# Patient Record
Sex: Female | Born: 1975 | Race: White | Hispanic: No | Marital: Married | Smoking: Never smoker
Health system: Southern US, Community
[De-identification: ages and names within clinical notes are randomized; demographics above are authoritative.]

## PROBLEM LIST (undated history)

## (undated) DIAGNOSIS — T7840XA Allergy, unspecified, initial encounter: Secondary | ICD-10-CM

## (undated) DIAGNOSIS — N200 Calculus of kidney: Secondary | ICD-10-CM

## (undated) DIAGNOSIS — R87619 Unspecified abnormal cytological findings in specimens from cervix uteri: Secondary | ICD-10-CM

## (undated) DIAGNOSIS — IMO0002 Reserved for concepts with insufficient information to code with codable children: Secondary | ICD-10-CM

## (undated) DIAGNOSIS — I341 Nonrheumatic mitral (valve) prolapse: Secondary | ICD-10-CM

## (undated) DIAGNOSIS — F909 Attention-deficit hyperactivity disorder, unspecified type: Secondary | ICD-10-CM

## (undated) DIAGNOSIS — F419 Anxiety disorder, unspecified: Secondary | ICD-10-CM

## (undated) HISTORY — DX: Nonrheumatic mitral (valve) prolapse: I34.1

## (undated) HISTORY — DX: Unspecified abnormal cytological findings in specimens from cervix uteri: R87.619

## (undated) HISTORY — DX: Reserved for concepts with insufficient information to code with codable children: IMO0002

## (undated) HISTORY — DX: Allergy, unspecified, initial encounter: T78.40XA

## (undated) HISTORY — PX: LAPAROSCOPY: SHX197

## (undated) HISTORY — PX: KNEE SURGERY: SHX244

## (undated) HISTORY — DX: Calculus of kidney: N20.0

## (undated) HISTORY — DX: Anxiety disorder, unspecified: F41.9

## (undated) HISTORY — DX: Attention-deficit hyperactivity disorder, unspecified type: F90.9

## (undated) HISTORY — PX: COLPOSCOPY: SHX161

---

## 2003-12-27 ENCOUNTER — Ambulatory Visit: Payer: Self-pay

## 2004-03-10 ENCOUNTER — Ambulatory Visit (HOSPITAL_COMMUNITY): Admission: RE | Admit: 2004-03-10 | Discharge: 2004-03-10 | Payer: Self-pay | Admitting: Gynecology

## 2005-01-30 ENCOUNTER — Ambulatory Visit (HOSPITAL_COMMUNITY): Admission: RE | Admit: 2005-01-30 | Discharge: 2005-01-30 | Payer: Self-pay | Admitting: Gynecology

## 2005-05-01 ENCOUNTER — Ambulatory Visit: Payer: Self-pay | Admitting: Family Medicine

## 2005-05-04 ENCOUNTER — Ambulatory Visit: Payer: Self-pay | Admitting: Family Medicine

## 2005-05-07 ENCOUNTER — Ambulatory Visit: Payer: Self-pay | Admitting: Family Medicine

## 2005-05-14 ENCOUNTER — Ambulatory Visit: Payer: Self-pay | Admitting: Gynecology

## 2005-05-17 ENCOUNTER — Ambulatory Visit: Payer: Self-pay | Admitting: Obstetrics & Gynecology

## 2005-05-31 ENCOUNTER — Ambulatory Visit: Payer: Self-pay | Admitting: Obstetrics & Gynecology

## 2005-06-12 ENCOUNTER — Ambulatory Visit: Payer: Self-pay | Admitting: Family Medicine

## 2005-06-19 ENCOUNTER — Ambulatory Visit: Payer: Self-pay | Admitting: Family Medicine

## 2005-06-22 ENCOUNTER — Ambulatory Visit: Payer: Self-pay | Admitting: *Deleted

## 2005-06-22 ENCOUNTER — Inpatient Hospital Stay (HOSPITAL_COMMUNITY): Admission: AD | Admit: 2005-06-22 | Discharge: 2005-06-24 | Payer: Self-pay | Admitting: Obstetrics & Gynecology

## 2005-06-25 ENCOUNTER — Encounter: Admission: RE | Admit: 2005-06-25 | Discharge: 2005-07-24 | Payer: Self-pay | Admitting: Gynecology

## 2005-07-25 ENCOUNTER — Encounter: Admission: RE | Admit: 2005-07-25 | Discharge: 2005-08-24 | Payer: Self-pay | Admitting: Gynecology

## 2005-08-25 ENCOUNTER — Encounter: Admission: RE | Admit: 2005-08-25 | Discharge: 2005-09-24 | Payer: Self-pay | Admitting: Gynecology

## 2005-09-13 ENCOUNTER — Observation Stay: Payer: Self-pay | Admitting: Surgery

## 2005-09-25 ENCOUNTER — Encounter: Admission: RE | Admit: 2005-09-25 | Discharge: 2005-10-24 | Payer: Self-pay | Admitting: Gynecology

## 2005-10-25 ENCOUNTER — Encounter: Admission: RE | Admit: 2005-10-25 | Discharge: 2005-11-24 | Payer: Self-pay | Admitting: Gynecology

## 2005-11-13 ENCOUNTER — Ambulatory Visit: Payer: Self-pay | Admitting: Obstetrics and Gynecology

## 2005-11-13 ENCOUNTER — Encounter (INDEPENDENT_AMBULATORY_CARE_PROVIDER_SITE_OTHER): Payer: Self-pay | Admitting: Specialist

## 2005-11-13 ENCOUNTER — Ambulatory Visit: Payer: Self-pay | Admitting: Family Medicine

## 2005-11-16 ENCOUNTER — Ambulatory Visit: Payer: Self-pay | Admitting: Family Medicine

## 2005-11-25 ENCOUNTER — Encounter: Admission: RE | Admit: 2005-11-25 | Discharge: 2005-12-24 | Payer: Self-pay | Admitting: Gynecology

## 2005-12-25 ENCOUNTER — Encounter: Admission: RE | Admit: 2005-12-25 | Discharge: 2006-01-24 | Payer: Self-pay | Admitting: Gynecology

## 2006-01-10 ENCOUNTER — Ambulatory Visit: Payer: Self-pay | Admitting: Obstetrics & Gynecology

## 2006-01-25 ENCOUNTER — Encounter: Admission: RE | Admit: 2006-01-25 | Discharge: 2006-02-24 | Payer: Self-pay | Admitting: Gynecology

## 2006-02-25 ENCOUNTER — Encounter: Admission: RE | Admit: 2006-02-25 | Discharge: 2006-03-25 | Payer: Self-pay | Admitting: Gynecology

## 2006-03-26 ENCOUNTER — Encounter: Admission: RE | Admit: 2006-03-26 | Discharge: 2006-04-25 | Payer: Self-pay | Admitting: Gynecology

## 2006-04-26 ENCOUNTER — Encounter: Admission: RE | Admit: 2006-04-26 | Discharge: 2006-05-25 | Payer: Self-pay | Admitting: Gynecology

## 2006-05-26 ENCOUNTER — Encounter: Admission: RE | Admit: 2006-05-26 | Discharge: 2006-06-25 | Payer: Self-pay | Admitting: Gynecology

## 2006-06-26 ENCOUNTER — Encounter: Admission: RE | Admit: 2006-06-26 | Discharge: 2006-07-25 | Payer: Self-pay | Admitting: Gynecology

## 2006-07-26 ENCOUNTER — Encounter: Admission: RE | Admit: 2006-07-26 | Discharge: 2006-08-25 | Payer: Self-pay | Admitting: Gynecology

## 2006-08-26 ENCOUNTER — Encounter: Admission: RE | Admit: 2006-08-26 | Discharge: 2006-09-25 | Payer: Self-pay | Admitting: Gynecology

## 2006-09-26 ENCOUNTER — Encounter: Admission: RE | Admit: 2006-09-26 | Discharge: 2006-10-25 | Payer: Self-pay | Admitting: Gynecology

## 2006-10-26 ENCOUNTER — Encounter: Admission: RE | Admit: 2006-10-26 | Discharge: 2006-11-25 | Payer: Self-pay | Admitting: Gynecology

## 2006-11-26 ENCOUNTER — Encounter: Admission: RE | Admit: 2006-11-26 | Discharge: 2006-12-25 | Payer: Self-pay | Admitting: Gynecology

## 2006-12-26 ENCOUNTER — Encounter: Admission: RE | Admit: 2006-12-26 | Discharge: 2007-01-25 | Payer: Self-pay | Admitting: Gynecology

## 2007-01-26 ENCOUNTER — Encounter: Admission: RE | Admit: 2007-01-26 | Discharge: 2007-02-25 | Payer: Self-pay | Admitting: Gynecology

## 2007-02-26 ENCOUNTER — Encounter: Admission: RE | Admit: 2007-02-26 | Discharge: 2007-03-28 | Payer: Self-pay | Admitting: Gynecology

## 2008-04-19 ENCOUNTER — Encounter: Payer: Self-pay | Admitting: Family Medicine

## 2008-04-19 ENCOUNTER — Ambulatory Visit: Payer: Self-pay | Admitting: Family Medicine

## 2008-04-19 LAB — CONVERTED CEMR LAB
HCT: 39 % (ref 36.0–46.0)
Hemoglobin: 13.1 g/dL (ref 12.0–15.0)
MCHC: 33.6 g/dL (ref 30.0–36.0)
MCV: 90.3 fL (ref 78.0–100.0)
Platelets: 257 10*3/uL (ref 150–400)
RDW: 12.1 % (ref 11.5–15.5)
TSH: 1.636 microintl units/mL (ref 0.350–4.500)
WBC: 7.6 10*3/uL (ref 4.0–10.5)

## 2008-10-20 ENCOUNTER — Ambulatory Visit: Payer: Self-pay | Admitting: Psychiatry

## 2008-10-28 ENCOUNTER — Ambulatory Visit: Payer: Self-pay | Admitting: Psychiatry

## 2008-11-04 ENCOUNTER — Ambulatory Visit: Payer: Self-pay | Admitting: Psychiatry

## 2008-11-18 ENCOUNTER — Ambulatory Visit: Payer: Self-pay | Admitting: Psychiatry

## 2008-11-25 ENCOUNTER — Ambulatory Visit: Payer: Self-pay | Admitting: Psychiatry

## 2008-12-16 ENCOUNTER — Ambulatory Visit: Payer: Self-pay | Admitting: Psychiatry

## 2009-01-12 ENCOUNTER — Ambulatory Visit: Payer: Self-pay | Admitting: Psychiatry

## 2009-01-26 ENCOUNTER — Ambulatory Visit: Payer: Self-pay | Admitting: Psychiatry

## 2009-02-22 ENCOUNTER — Ambulatory Visit: Payer: Self-pay | Admitting: Family Medicine

## 2009-05-04 ENCOUNTER — Ambulatory Visit: Payer: Self-pay | Admitting: Family Medicine

## 2009-07-28 ENCOUNTER — Ambulatory Visit: Payer: Self-pay | Admitting: Family Medicine

## 2009-09-07 ENCOUNTER — Ambulatory Visit: Payer: Self-pay | Admitting: Family Medicine

## 2009-09-09 ENCOUNTER — Ambulatory Visit: Payer: Self-pay | Admitting: Family Medicine

## 2010-01-28 ENCOUNTER — Encounter: Payer: Self-pay | Admitting: Allergy and Immunology

## 2010-05-23 NOTE — Assessment & Plan Note (Signed)
Kristen Wu, Kristen Wu NO.:  [id number]   MEDICAL RECORD NO.:  [id number]          PATIENT TYPE:  POB   LOCATION:  [facility] at [facility]         FACILITY:  [facility]   PHYSICIAN:  Tinnie Gens, MD        DATE OF BIRTH:  1975/09/02   DATE OF SERVICE:  04/19/2008                                  CLINIC NOTE   HISTORY OF PRESENT ILLNESS:  The patient is a 35 year old gravida 3,  para 3, who comes today for yearly exam.  She has last been seen here in  2008, when she was seen for occult blood with low grade SIL.  She was  laid off and without insurance, so she did not follow up on any of her  Paps.  She has recently started back at [facility] for the last 5 months, but  she continues to have difficulty with concentration, being cranky,  having crying spells, poor sleeping, awakening every 2-3 hours, early  a.m. awakening, her weight is stable.  Her appetite has been up and  down.  She feels fatigued all the time.  She does report her kids are  well.  She has had some marital strife as well and issues with her  friends.  She is unwilling to go on medication at this time, but is  worried about her mood.   PAST MEDICAL HISTORY:  Significant for mitral valve prolapse.   PAST SURGICAL HISTORY:  Laparoscopy in 2001 for questionable ectopic,  right knee scope.   MEDICATIONS:  She is on Mirena IUD and B12.   ALLERGIES:  SULFA drugs.   FAMILY HISTORY:  Negative.   SOCIAL HISTORY:  No tobacco, alcohol, or drug use.   OBSTETRICAL HISTORY:  G3, P3, and 3 vaginal deliveries.   GYNECOLOGICAL HISTORY:  The patient has some amenorrhea secondary to IUD  which has been in place for the past 3 years.   REVIEW OF SYSTEMS:  A 14-point review of systems is reviewed.  Please  see HPI.  She denies chest pain, shortness of breath, headache, vision  changes, loss of hearing, nausea, vomiting, diarrhea, constipation,  fevers, chills, easy bruising, vaginal discharge, vaginal odor, or pain  with  intercourse.   PHYSICAL EXAMINATION:  VITAL SIGNS:  On exam today, her vitals are as in  the chart.  Her weight is stable 136.  GENERAL:  She is a well-developed, well-nourished female in no acute  distress.  HEENT:  Normocephalic and atraumatic.  Sclerae anicteric.  NECK:  Supple.  Normal thyroid.  LUNGS:  Clear bilaterally.  CV:  Regular rate and rhythm.  No rubs, gallops, or murmurs.  ABDOMEN:  Soft, nontender, and nondistended.  EXTREMITIES:  There is no cyanosis, clubbing, or edema.  BREASTS:  Symmetric with everted nipples.  No masses.  No  supraclavicular or axillary adenopathy.  GU:  Normal external female genitalia.  BUS normal.  Vagina is pink and  rugated.  Cervix is parous without lesion.  Uterus is small, anteverted.  No adnexal mass or tenderness.   IMPRESSION:  1. Gynecological exam.  2. Situational depression and anxiety.   PLAN:  1. We will refer her to Dr.  Dirk Dress.  2. Check TSH and CBC.  3. Pap smear today.  4. Follow up for medication if needed for depression.           ______________________________  Tinnie Gens, MD     TP/MEDQ  D:  04/19/2008  T:  04/20/2008  Job:  [id number]

## 2010-05-23 NOTE — Group Therapy Note (Signed)
NAMEMENNA, ABELN NO.:  [id number]   MEDICAL RECORD NO.:  [id number]          PATIENT TYPE:  POB   LOCATION:  [facility]                   FACILITY:  [facility]   PHYSICIAN:  Caren Griffins, CNM       DATE OF BIRTH:  05-Apr-1975   DATE OF SERVICE:                                    CLINIC NOTE   REASON FOR VISIT:  Desires IUD insertion, 4 months' postpartum.   HISTORY:  This is a 35 year old G3, P3-0-0-3, who has had a spontaneous  vaginal delivery 4-1/2 months ago that was uncomplicated.  She breastfed for  2 months then changed to bottle.  Pecola Leisure is thriving, in fact, 75th and 90th  percentile for growth.  She denies having had problems with abnormal  bleeding, with postpartum depression, stress or sleep.  She is counseled on  IUD including risks and has signed a consent.   Her UPT today is negative.  She has a single sexual partner and has no  history of STDs.  Denies any vaginal discharge or itching.  She does  frequent Kegel exercises and denies SUI.   She has no allergies and is on no regular medications.   PAST MEDICAL HISTORY:  Significant for having mitral valve prolapse which  was diagnosed several years ago during an evaluation for chest pain, which  has not recurred.   SURGERIES:  1. Right knee in 1992.  2. Laparoscopic for suspected ectopic in 2001 with her second pregnancy.   SOCIAL HISTORY:  She is a nonsmoker.   PHYSICAL EXAMINATION:  VITAL SIGNS:  Blood pressure 112/71, pulse 75, weight  135.  NECK:  Supple with no thyroid enlargement.  HEART:  Regular rate and rhythm.  No murmur or click is appreciated.  LUNGS:  Clear to auscultation bilateral.  ABDOMEN:  Soft, flat, nontender.  PELVIC:  NEFG.  Good tone.  Fair support.  Vagina rugated, pink.  Cervix is  mid to anterior.  Bimanual--uterus is retroverted.  The tenaculum is first  applied at 10 and 2 with traction and some difficultly inserting the sound.  Tenaculum was reapplied at 4 and 8 and  sounded to 5.5-6 and Jearld Adjutant was  placed with minimal difficulty.  She has some excessive bleeding from the  site of the tenaculum.  Speculum was reinserted, a clot removed.  There is  no active bleeding.  It is not necessary to do anything to stop bleeding.   The patient tolerated the procedure well.   ASSESSMENT:  1. Multiparous normal postpartum.  2. IUD contraception.   PLAN:  She will do string checks and monitor whether the IUD is in place  after menses.  Of note, the string was cut at approximately 2-2.5  centimeters length and she will return in 3 months for a string check.           ______________________________  Caren Griffins, CNM     DP/MEDQ  D:  11/13/2005  T:  11/14/2005  Job:  [id number]

## 2011-05-10 ENCOUNTER — Ambulatory Visit: Payer: Self-pay | Admitting: Family Medicine

## 2011-05-14 ENCOUNTER — Ambulatory Visit: Payer: Self-pay | Admitting: Family Medicine

## 2011-05-22 ENCOUNTER — Ambulatory Visit (INDEPENDENT_AMBULATORY_CARE_PROVIDER_SITE_OTHER): Payer: Self-pay | Admitting: Family Medicine

## 2011-05-22 ENCOUNTER — Encounter: Payer: Self-pay | Admitting: Family Medicine

## 2011-05-22 VITALS — BP 126/82 | HR 61 | Ht 66.0 in | Wt 130.0 lb

## 2011-05-22 DIAGNOSIS — R102 Pelvic and perineal pain: Secondary | ICD-10-CM

## 2011-05-22 DIAGNOSIS — Z124 Encounter for screening for malignant neoplasm of cervix: Secondary | ICD-10-CM

## 2011-05-22 DIAGNOSIS — I059 Rheumatic mitral valve disease, unspecified: Secondary | ICD-10-CM

## 2011-05-22 DIAGNOSIS — F411 Generalized anxiety disorder: Secondary | ICD-10-CM

## 2011-05-22 DIAGNOSIS — J45909 Unspecified asthma, uncomplicated: Secondary | ICD-10-CM | POA: Insufficient documentation

## 2011-05-22 DIAGNOSIS — I341 Nonrheumatic mitral (valve) prolapse: Secondary | ICD-10-CM

## 2011-05-22 DIAGNOSIS — N200 Calculus of kidney: Secondary | ICD-10-CM | POA: Insufficient documentation

## 2011-05-22 DIAGNOSIS — J309 Allergic rhinitis, unspecified: Secondary | ICD-10-CM

## 2011-05-22 DIAGNOSIS — F419 Anxiety disorder, unspecified: Secondary | ICD-10-CM

## 2011-05-22 DIAGNOSIS — F909 Attention-deficit hyperactivity disorder, unspecified type: Secondary | ICD-10-CM

## 2011-05-22 DIAGNOSIS — N949 Unspecified condition associated with female genital organs and menstrual cycle: Secondary | ICD-10-CM

## 2011-05-22 DIAGNOSIS — Z1151 Encounter for screening for human papillomavirus (HPV): Secondary | ICD-10-CM

## 2011-05-22 DIAGNOSIS — G44209 Tension-type headache, unspecified, not intractable: Secondary | ICD-10-CM

## 2011-05-22 DIAGNOSIS — Z01419 Encounter for gynecological examination (general) (routine) without abnormal findings: Secondary | ICD-10-CM

## 2011-05-22 NOTE — Progress Notes (Signed)
  Subjective:     Kristen Wu is a 36 y.o. female and is here for a comprehensive physical exam. The patient reports problems - reports 1 month h/o low back pain on the right.  Saw PCP who thought it might be kidney stones.  Passed one this am.  Had CT which showed multiple stones.  She also had u/s which was suggestive of pelvic congestion syndrome.  Reports several day h/o pain in RLQ also.  She is also due for IUD change which expired 11/2010, no cycles have returned as yet. Marland Kitchen  History   Social History  . Marital Status: Married    Spouse Name: N/A    Number of Children: N/A  . Years of Education: N/A   Occupational History  . Not on file.   Social History Main Topics  . Smoking status: Never Smoker   . Smokeless tobacco: Not on file  . Alcohol Use: No     occassionally  . Drug Use: No  . Sexually Active: Yes -- Female partner(s)    Birth Control/ Protection: IUD   Other Topics Concern  . Not on file   Social History Narrative  . No narrative on file   Health Maintenance  Topic Date Due  . Pap Smear  01/13/1992  . Tetanus/tdap  01/12/1993  . Influenza Vaccine  10/09/2011    The following portions of the patient's history were reviewed and updated as appropriate: allergies, current medications, past family history, past medical history, past social history, past surgical history and problem list.  Review of Systems A comprehensive review of systems was negative.   Objective:    BP 126/82  Pulse 61  Ht 5\' 6"  (1.676 m)  Wt 130 lb (58.968 kg)  BMI 20.98 kg/m2 General appearance: alert, cooperative and appears stated age Head: Normocephalic, without obvious abnormality, atraumatic Neck: no adenopathy, supple, symmetrical, trachea midline and thyroid not enlarged, symmetric, no tenderness/mass/nodules Lungs: clear to auscultation bilaterally Breasts: normal appearance, no masses or tenderness Heart: regular rate and rhythm, S1, S2 normal, no murmur, click, rub or  gallop Abdomen: soft, non-tender; bowel sounds normal; no masses,  no organomegaly Pelvic: cervix normal in appearance, external genitalia normal, no adnexal masses or tenderness, no cervical motion tenderness, uterus normal size, shape, and consistency and vagina normal without discharge Extremities: extremities normal, atraumatic, no cyanosis or edema Pulses: 2+ and symmetric Skin: Skin color, texture, turgor normal. No rashes or lesions Lymph nodes: Cervical, supraclavicular, and axillary nodes normal. Neurologic: Grossly normal    Assessment:    Healthy female exam. RLQ pain of unclear etiology.  Passed kidney stone without relief.  Interested in pursuing w/u/rx for pelvic congestion with interventional radiology. Discussed possible dx scope for pain, other potential causes and reasons for stones.       Plan:  Pap smear Blood work per PCP Referral to interventional radiology for w/u of PCS. Return for IUD replacement.   See After Visit Summary for Counseling Recommendations

## 2011-05-22 NOTE — Patient Instructions (Addendum)
Levonorgestrel intrauterine device (IUD) What is this medicine? LEVONORGESTREL IUD (LEE voe nor jes trel) is a contraceptive (birth control) device. It is used to prevent pregnancy and to treat heavy bleeding that occurs during your period. It can be used for up to 5 years. This medicine may be used for other purposes; ask your health care provider or pharmacist if you have questions. What should I tell my health care provider before I take this medicine? They need to know if you have any of these conditions: -abnormal Pap smear -cancer of the breast, uterus, or cervix -diabetes -endometritis -genital or pelvic infection now or in the past -have more than one sexual partner or your partner has more than one partner -heart disease -history of an ectopic or tubal pregnancy -immune system problems -IUD in place -liver disease or tumor -problems with blood clots or take blood-thinners -use intravenous drugs -uterus of unusual shape -vaginal bleeding that has not been explained -an unusual or allergic reaction to levonorgestrel, other hormones, silicone, or polyethylene, medicines, foods, dyes, or preservatives -pregnant or trying to get pregnant -breast-feeding How should I use this medicine? This device is placed inside the uterus by a health care professional. Talk to your pediatrician regarding the use of this medicine in children. Special care may be needed. Overdosage: If you think you have taken too much of this medicine contact a poison control center or emergency room at once. NOTE: This medicine is only for you. Do not share this medicine with others. What if I miss a dose? This does not apply. What may interact with this medicine? Do not take this medicine with any of the following medications: -amprenavir -bosentan -fosamprenavir This medicine may also interact with the following medications: -aprepitant -barbiturate medicines for inducing sleep or treating  seizures -bexarotene -griseofulvin -medicines to treat seizures like carbamazepine, ethotoin, felbamate, oxcarbazepine, phenytoin, topiramate -modafinil -pioglitazone -rifabutin -rifampin -rifapentine -some medicines to treat HIV infection like atazanavir, indinavir, lopinavir, nelfinavir, tipranavir, ritonavir -St. John's wort -warfarin This list may not describe all possible interactions. Give your health care provider a list of all the medicines, herbs, non-prescription drugs, or dietary supplements you use. Also tell them if you smoke, drink alcohol, or use illegal drugs. Some items may interact with your medicine. What should I watch for while using this medicine? Visit your doctor or health care professional for regular check ups. See your doctor if you or your partner has sexual contact with others, becomes HIV positive, or gets a sexual transmitted disease. This product does not protect you against HIV infection (AIDS) or other sexually transmitted diseases. You can check the placement of the IUD yourself by reaching up to the top of your vagina with clean fingers to feel the threads. Do not pull on the threads. It is a good habit to check placement after each menstrual period. Call your doctor right away if you feel more of the IUD than just the threads or if you cannot feel the threads at all. The IUD may come out by itself. You may become pregnant if the device comes out. If you notice that the IUD has come out use a backup birth control method like condoms and call your health care provider. Using tampons will not change the position of the IUD and are okay to use during your period. What side effects may I notice from receiving this medicine? Side effects that you should report to your doctor or health care professional as soon as possible: -allergic reactions   like skin rash, itching or hives, swelling of the face, lips, or tongue -fever, flu-like symptoms -genital sores -high  blood pressure -no menstrual period for 6 weeks during use -pain, swelling, warmth in the leg -pelvic pain or tenderness -severe or sudden headache -signs of pregnancy -stomach cramping -sudden shortness of breath -trouble with balance, talking, or walking -unusual vaginal bleeding, discharge -yellowing of the eyes or skin Side effects that usually do not require medical attention (report to your doctor or health care professional if they continue or are bothersome): -acne -breast pain -change in sex drive or performance -changes in weight -cramping, dizziness, or faintness while the device is being inserted -headache -irregular menstrual bleeding within first 3 to 6 months of use -nausea This list may not describe all possible side effects. Call your doctor for medical advice about side effects. You may report side effects to FDA at 1-800-FDA-1088. Where should I keep my medicine? This does not apply. NOTE: This sheet is a summary. It may not cover all possible information. If you have questions about this medicine, talk to your doctor, pharmacist, or health care provider.  2012, [employer]. (01/16/2008 6:39:08 PM)Preventive Care for Adults, Female A healthy lifestyle and preventive care can promote health and wellness. Preventive health guidelines for women include the following key practices.  A routine yearly physical is a good way to check with your caregiver about your health and preventive screening. It is a chance to share any concerns and updates on your health, and to receive a thorough exam.   Visit your dentist for a routine exam and preventive care every 6 months. Brush your teeth twice a day and floss once a day. Good oral hygiene prevents tooth decay and gum disease.   The frequency of eye exams is based on your age, health, family medical history, use of contact lenses, and other factors. Follow your caregiver's recommendations for frequency of eye exams.    Eat a healthy diet. Foods like vegetables, fruits, whole grains, low-fat dairy products, and lean protein foods contain the nutrients you need without too many calories. Decrease your intake of foods high in solid fats, added sugars, and salt. Eat the right amount of calories for you.Get information about a proper diet from your caregiver, if necessary.   Regular physical exercise is one of the most important things you can do for your health. Most adults should get at least 150 minutes of moderate-intensity exercise (any activity that increases your heart rate and causes you to sweat) each week. In addition, most adults need muscle-strengthening exercises on 2 or more days a week.   Maintain a healthy weight. The body mass index (BMI) is a screening tool to identify possible weight problems. It provides an estimate of body fat based on height and weight. Your caregiver can help determine your BMI, and can help you achieve or maintain a healthy weight.For adults 20 years and older:   A BMI below 18.5 is considered underweight.   A BMI of 18.5 to 24.9 is normal.   A BMI of 25 to 29.9 is considered overweight.   A BMI of 30 and above is considered obese.   Maintain normal blood lipids and cholesterol levels by exercising and minimizing your intake of saturated fat. Eat a balanced diet with plenty of fruit and vegetables. Blood tests for lipids and cholesterol should begin at age 104 and be repeated every 5 years. If your lipid or cholesterol levels are high, you are over 50,  or you are at high risk for heart disease, you may need your cholesterol levels checked more frequently.Ongoing high lipid and cholesterol levels should be treated with medicines if diet and exercise are not effective.   If you smoke, find out from your caregiver how to quit. If you do not use tobacco, do not start.   If you are pregnant, do not drink alcohol. If you are breastfeeding, be very cautious about drinking  alcohol. If you are not pregnant and choose to drink alcohol, do not exceed 1 drink per day. One drink is considered to be 12 ounces (355 mL) of beer, 5 ounces (148 mL) of wine, or 1.5 ounces (44 mL) of liquor.   Avoid use of street drugs. Do not share needles with anyone. Ask for help if you need support or instructions about stopping the use of drugs.   High blood pressure causes heart disease and increases the risk of stroke. Your blood pressure should be checked at least every 1 to 2 years. Ongoing high blood pressure should be treated with medicines if weight loss and exercise are not effective.   If you are 31 to 37 years old, ask your caregiver if you should take aspirin to prevent strokes.   Diabetes screening involves taking a blood sample to check your fasting blood sugar level. This should be done once every 3 years, after age 39, if you are within normal weight and without risk factors for diabetes. Testing should be considered at a younger age or be carried out more frequently if you are overweight and have at least 1 risk factor for diabetes.   Breast cancer screening is essential preventive care for women. You should practice "breast self-awareness." This means understanding the normal appearance and feel of your breasts and may include breast self-examination. Any changes detected, no matter how small, should be reported to a caregiver. Women in their 80s and 30s should have a clinical breast exam (CBE) by a caregiver as part of a regular health exam every 1 to 3 years. After age 25, women should have a CBE every year. Starting at age 25, women should consider having a mammography (breast X-ray test) every year. Women who have a family history of breast cancer should talk to their caregiver about genetic screening. Women at a high risk of breast cancer should talk to their caregivers about having magnetic resonance imaging (MRI) and a mammography every year.   The Pap test is a screening  test for cervical cancer. A Pap test can show cell changes on the cervix that might become cervical cancer if left untreated. A Pap test is a procedure in which cells are obtained and examined from the lower end of the uterus (cervix).   Women should have a Pap test starting at age 10.   Between ages 87 and 42, Pap tests should be repeated every 2 years.   Beginning at age 35, you should have a Pap test every 3 years as long as the past 3 Pap tests have been normal.   Some women have medical problems that increase the chance of getting cervical cancer. Talk to your caregiver about these problems. It is especially important to talk to your caregiver if a new problem develops soon after your last Pap test. In these cases, your caregiver may recommend more frequent screening and Pap tests.   The above recommendations are the same for women who have or have not gotten the vaccine for human papillomavirus (HPV).  If you had a hysterectomy for a problem that was not cancer or a condition that could lead to cancer, then you no longer need Pap tests. Even if you no longer need a Pap test, a regular exam is a good idea to make sure no other problems are starting.   If you are between ages 16 and 6, and you have had normal Pap tests going back 10 years, you no longer need Pap tests. Even if you no longer need a Pap test, a regular exam is a good idea to make sure no other problems are starting.   If you have had past treatment for cervical cancer or a condition that could lead to cancer, you need Pap tests and screening for cancer for at least 20 years after your treatment.   If Pap tests have been discontinued, risk factors (such as a new sexual partner) need to be reassessed to determine if screening should be resumed.   The HPV test is an additional test that may be used for cervical cancer screening. The HPV test looks for the virus that can cause the cell changes on the cervix. The cells collected  during the Pap test can be tested for HPV. The HPV test could be used to screen women aged 57 years and older, and should be used in women of any age who have unclear Pap test results. After the age of 60, women should have HPV testing at the same frequency as a Pap test.   Colorectal cancer can be detected and often prevented. Most routine colorectal cancer screening begins at the age of 41 and continues through age 38. However, your caregiver may recommend screening at an earlier age if you have risk factors for colon cancer. On a yearly basis, your caregiver may provide home test kits to check for hidden blood in the stool. Use of a small camera at the end of a tube, to directly examine the colon (sigmoidoscopy or colonoscopy), can detect the earliest forms of colorectal cancer. Talk to your caregiver about this at age 74, when routine screening begins. Direct examination of the colon should be repeated every 5 to 10 years through age 79, unless early forms of pre-cancerous polyps or small growths are found.   Hepatitis C blood testing is recommended for all people born from [birth year] through 1965 and any individual with known risks for hepatitis C.   Practice safe sex. Use condoms and avoid high-risk sexual practices to reduce the spread of sexually transmitted infections (STIs). STIs include gonorrhea, chlamydia, syphilis, trichomonas, herpes, HPV, and human immunodeficiency virus (HIV). Herpes, HIV, and HPV are viral illnesses that have no cure. They can result in disability, cancer, and death. Sexually active women aged 23 and younger should be checked for chlamydia. Older women with new or multiple partners should also be tested for chlamydia. Testing for other STIs is recommended if you are sexually active and at increased risk.   Osteoporosis is a disease in which the bones lose minerals and strength with aging. This can result in serious bone fractures. The risk of osteoporosis can be identified using  a bone density scan. Women ages 42 and over and women at risk for fractures or osteoporosis should discuss screening with their caregivers. Ask your caregiver whether you should take a calcium supplement or vitamin D to reduce the rate of osteoporosis.   Menopause can be associated with physical symptoms and risks. Hormone replacement therapy is available to decrease symptoms and risks.  You should talk to your caregiver about whether hormone replacement therapy is right for you.   Use sunscreen with sun protection factor (SPF) of 30 or more. Apply sunscreen liberally and repeatedly throughout the day. You should seek shade when your shadow is shorter than you. Protect yourself by wearing long sleeves, pants, a wide-brimmed hat, and sunglasses year round, whenever you are outdoors.   Once a month, do a whole body skin exam, using a mirror to look at the skin on your back. Notify your caregiver of new moles, moles that have irregular borders, moles that are larger than a pencil eraser, or moles that have changed in shape or color.   Stay current with required immunizations.   Influenza. You need a dose every fall (or winter). The composition of the flu vaccine changes each year, so being vaccinated once is not enough.   Pneumococcal polysaccharide. You need 1 to 2 doses if you smoke cigarettes or if you have certain chronic medical conditions. You need 1 dose at age 59 (or older) if you have never been vaccinated.   Tetanus, diphtheria, pertussis (Tdap, Td). Get 1 dose of Tdap vaccine if you are younger than age 1, are over 62 and have contact with an infant, are a Research scientist (physical sciences), are pregnant, or simply want to be protected from whooping cough. After that, you need a Td booster dose every 10 years. Consult your caregiver if you have not had at least 3 tetanus and diphtheria-containing shots sometime in your life or have a deep or dirty wound.   HPV. You need this vaccine if you are a woman age 56  or younger. The vaccine is given in 3 doses over 6 months.   Measles, mumps, rubella (MMR). You need at least 1 dose of MMR if you were born in 1957 or later. You may also need a second dose.   Meningococcal. If you are age 53 to 50 and a first-year college student living in a residence hall, or have one of several medical conditions, you need to get vaccinated against meningococcal disease. You may also need additional booster doses.   Zoster (shingles). If you are age 66 or older, you should get this vaccine.   Varicella (chickenpox). If you have never had chickenpox or you were vaccinated but received only 1 dose, talk to your caregiver to find out if you need this vaccine.   Hepatitis A. You need this vaccine if you have a specific risk factor for hepatitis A virus infection or you simply wish to be protected from this disease. The vaccine is usually given as 2 doses, 6 to 18 months apart.   Hepatitis B. You need this vaccine if you have a specific risk factor for hepatitis B virus infection or you simply wish to be protected from this disease. The vaccine is given in 3 doses, usually over 6 months.  Preventive Services / Frequency Ages 42 to 21  Blood pressure check.** / Every 1 to 2 years.   Lipid and cholesterol check.** / Every 5 years beginning at age 29.   Clinical breast exam.** / Every 3 years for women in their 37s and 30s.   Pap test.** / Every 2 years from ages 80 through 45. Every 3 years starting at age 6 through age 50 or 55 with a history of 3 consecutive normal Pap tests.   HPV screening.** / Every 3 years from ages 40 through ages 49 to 48 with a history of 3  consecutive normal Pap tests.   Hepatitis C blood test.** / For any individual with known risks for hepatitis C.   Skin self-exam. / Monthly.   Influenza immunization.** / Every year.   Pneumococcal polysaccharide immunization.** / 1 to 2 doses if you smoke cigarettes or if you have certain chronic medical  conditions.   Tetanus, diphtheria, pertussis (Tdap, Td) immunization. / A one-time dose of Tdap vaccine. After that, you need a Td booster dose every 10 years.   HPV immunization. / 3 doses over 6 months, if you are 80 and younger.   Measles, mumps, rubella (MMR) immunization. / You need at least 1 dose of MMR if you were born in 1957 or later. You may also need a second dose.   Meningococcal immunization. / 1 dose if you are age 83 to 98 and a first-year college student living in a residence hall, or have one of several medical conditions, you need to get vaccinated against meningococcal disease. You may also need additional booster doses.   Varicella immunization.** / Consult your caregiver.   Hepatitis A immunization.** / Consult your caregiver. 2 doses, 6 to 18 months apart.   Hepatitis B immunization.** / Consult your caregiver. 3 doses usually over 6 months.  Ages 52 to 30  Blood pressure check.** / Every 1 to 2 years.   Lipid and cholesterol check.** / Every 5 years beginning at age 57.   Clinical breast exam.** / Every year after age 54.   Mammogram.** / Every year beginning at age 72 and continuing for as long as you are in good health. Consult with your caregiver.   Pap test.** / Every 3 years starting at age 70 through age 7 or 74 with a history of 3 consecutive normal Pap tests.   HPV screening.** / Every 3 years from ages 25 through ages 76 to 13 with a history of 3 consecutive normal Pap tests.   Fecal occult blood test (FOBT) of stool. / Every year beginning at age 9 and continuing until age 69. You may not need to do this test if you get a colonoscopy every 10 years.   Flexible sigmoidoscopy or colonoscopy.** / Every 5 years for a flexible sigmoidoscopy or every 10 years for a colonoscopy beginning at age 52 and continuing until age 31.   Hepatitis C blood test.** / For all people born from [birth year] through 1966 and any individual with known risks for hepatitis C.    Skin self-exam. / Monthly.   Influenza immunization.** / Every year.   Pneumococcal polysaccharide immunization.** / 1 to 2 doses if you smoke cigarettes or if you have certain chronic medical conditions.   Tetanus, diphtheria, pertussis (Tdap, Td) immunization.** / A one-time dose of Tdap vaccine. After that, you need a Td booster dose every 10 years.   Measles, mumps, rubella (MMR) immunization. / You need at least 1 dose of MMR if you were born in 1957 or later. You may also need a second dose.   Varicella immunization.** / Consult your caregiver.   Meningococcal immunization.** / Consult your caregiver.   Hepatitis A immunization.** / Consult your caregiver. 2 doses, 6 to 18 months apart.   Hepatitis B immunization.** / Consult your caregiver. 3 doses, usually over 6 months.  Ages 60 and over  Blood pressure check.** / Every 1 to 2 years.   Lipid and cholesterol check.** / Every 5 years beginning at age 42.   Clinical breast exam.** / Every year after age  40.   Mammogram.** / Every year beginning at age 65 and continuing for as long as you are in good health. Consult with your caregiver.   Pap test.** / Every 3 years starting at age 57 through age 70 or 48 with a 3 consecutive normal Pap tests. Testing can be stopped between 65 and 70 with 3 consecutive normal Pap tests and no abnormal Pap or HPV tests in the past 10 years.   HPV screening.** / Every 3 years from ages 59 through ages 93 or 53 with a history of 3 consecutive normal Pap tests. Testing can be stopped between 65 and 70 with 3 consecutive normal Pap tests and no abnormal Pap or HPV tests in the past 10 years.   Fecal occult blood test (FOBT) of stool. / Every year beginning at age 35 and continuing until age 52. You may not need to do this test if you get a colonoscopy every 10 years.   Flexible sigmoidoscopy or colonoscopy.** / Every 5 years for a flexible sigmoidoscopy or every 10 years for a colonoscopy  beginning at age 69 and continuing until age 27.   Hepatitis C blood test.** / For all people born from [birth year] through 1966 and any individual with known risks for hepatitis C.   Osteoporosis screening.** / A one-time screening for women ages 48 and over and women at risk for fractures or osteoporosis.   Skin self-exam. / Monthly.   Influenza immunization.** / Every year.   Pneumococcal polysaccharide immunization.** / 1 dose at age 59 (or older) if you have never been vaccinated.   Tetanus, diphtheria, pertussis (Tdap, Td) immunization. / A one-time dose of Tdap vaccine if you are over 65 and have contact with an infant, are a Research scientist (physical sciences), or simply want to be protected from whooping cough. After that, you need a Td booster dose every 10 years.   Varicella immunization.** / Consult your caregiver.   Meningococcal immunization.** / Consult your caregiver.   Hepatitis A immunization.** / Consult your caregiver. 2 doses, 6 to 18 months apart.   Hepatitis B immunization.** / Check with your caregiver. 3 doses, usually over 6 months.  ** Family history and personal history of risk and conditions may change your caregiver's recommendations. Document Released: 02/20/2001 Document Revised: 12/14/2010 Document Reviewed: 05/22/2010 [facility] Patient Information 2012 [place], [place]. Kidney Stones Kidney stones (ureteral lithiasis) are deposits that form inside your kidneys. The intense pain is caused by the stone moving through the urinary tract. When the stone moves, the ureter goes into spasm around the stone. The stone is usually passed in the urine.  CAUSES   A disorder that makes certain neck glands produce too much parathyroid hormone (primary hyperparathyroidism).   A buildup of uric acid crystals.   Narrowing (stricture) of the ureter.   A kidney obstruction present at birth (congenital obstruction).   Previous surgery on the kidney or ureters.   Numerous kidney infections.   SYMPTOMS   Feeling sick to your stomach (nauseous).   Throwing up (vomiting).   Blood in the urine (hematuria).   Pain that usually spreads (radiates) to the groin.   Frequency or urgency of urination.  DIAGNOSIS   Taking a history and physical exam.   Blood or urine tests.   Computerized X-ray scan (CT scan).   Occasionally, an examination of the inside of the urinary bladder (cystoscopy) is performed.  TREATMENT   Observation.   Increasing your fluid intake.   Surgery may be needed  if you have severe pain or persistent obstruction.  The size, location, and chemical composition are all important variables that will determine the proper choice of action for you. Talk to your caregiver to better understand your situation so that you will minimize the risk of injury to yourself and your kidney.  HOME CARE INSTRUCTIONS   Drink enough water and fluids to keep your urine clear or pale yellow.   Strain all urine through the provided strainer. Keep all particulate matter and stones for your caregiver to see. The stone causing the pain may be as small as a grain of salt. It is very important to use the strainer each and every time you pass your urine. The collection of your stone will allow your caregiver to analyze it and verify that a stone has actually passed.   Only take over-the-counter or prescription medicines for pain, discomfort, or fever as directed by your caregiver.   Make a follow-up appointment with your caregiver as directed.   Get follow-up X-rays if required. The absence of pain does not always mean that the stone has passed. It may have only stopped moving. If the urine remains completely obstructed, it can cause loss of kidney function or even complete destruction of the kidney. It is your responsibility to make sure X-rays and follow-ups are completed. Ultrasounds of the kidney can show blockages and the status of the kidney. Ultrasounds are not associated with any  radiation and can be performed easily in a matter of minutes.  SEEK IMMEDIATE MEDICAL CARE IF:   Pain cannot be controlled with the prescribed medicine.   You have a fever.   The severity or intensity of pain increases over 18 hours and is not relieved by pain medicine.   You develop a new onset of abdominal pain.   You feel faint or pass out.  MAKE SURE YOU:   Understand these instructions.   Will watch your condition.   Will get help right away if you are not doing well or get worse.  Document Released: 12/25/2004 Document Revised: 12/14/2010 Document Reviewed: 04/22/2009 [facility] Patient Information 2012 [place], [place].

## 2011-05-22 NOTE — Progress Notes (Signed)
Addended by: Reva Bores on: 05/22/2011 04:22 PM   Modules accepted: Orders

## 2011-06-05 ENCOUNTER — Other Ambulatory Visit: Payer: Self-pay

## 2011-06-06 ENCOUNTER — Ambulatory Visit: Payer: Self-pay | Admitting: Family Medicine

## 2011-06-06 DIAGNOSIS — Z30430 Encounter for insertion of intrauterine contraceptive device: Secondary | ICD-10-CM

## 2011-07-30 IMAGING — CR RIGHT FOOT COMPLETE - 3+ VIEW
1 series · 3 of 3 positions shown · non-contrast
Comparison: none

REASON FOR EXAM: pain in limb
COMMENTS:

PROCEDURE:     KDR - KDXR FOOT RT COMPLETE W/OBLIQUES  - September 07, 2009  [DATE]
RESULT:     Comparison: None.

[Series 2: view not recorded · 0.17mm/px · 3 of 3 slices shown]
[im 1/3]
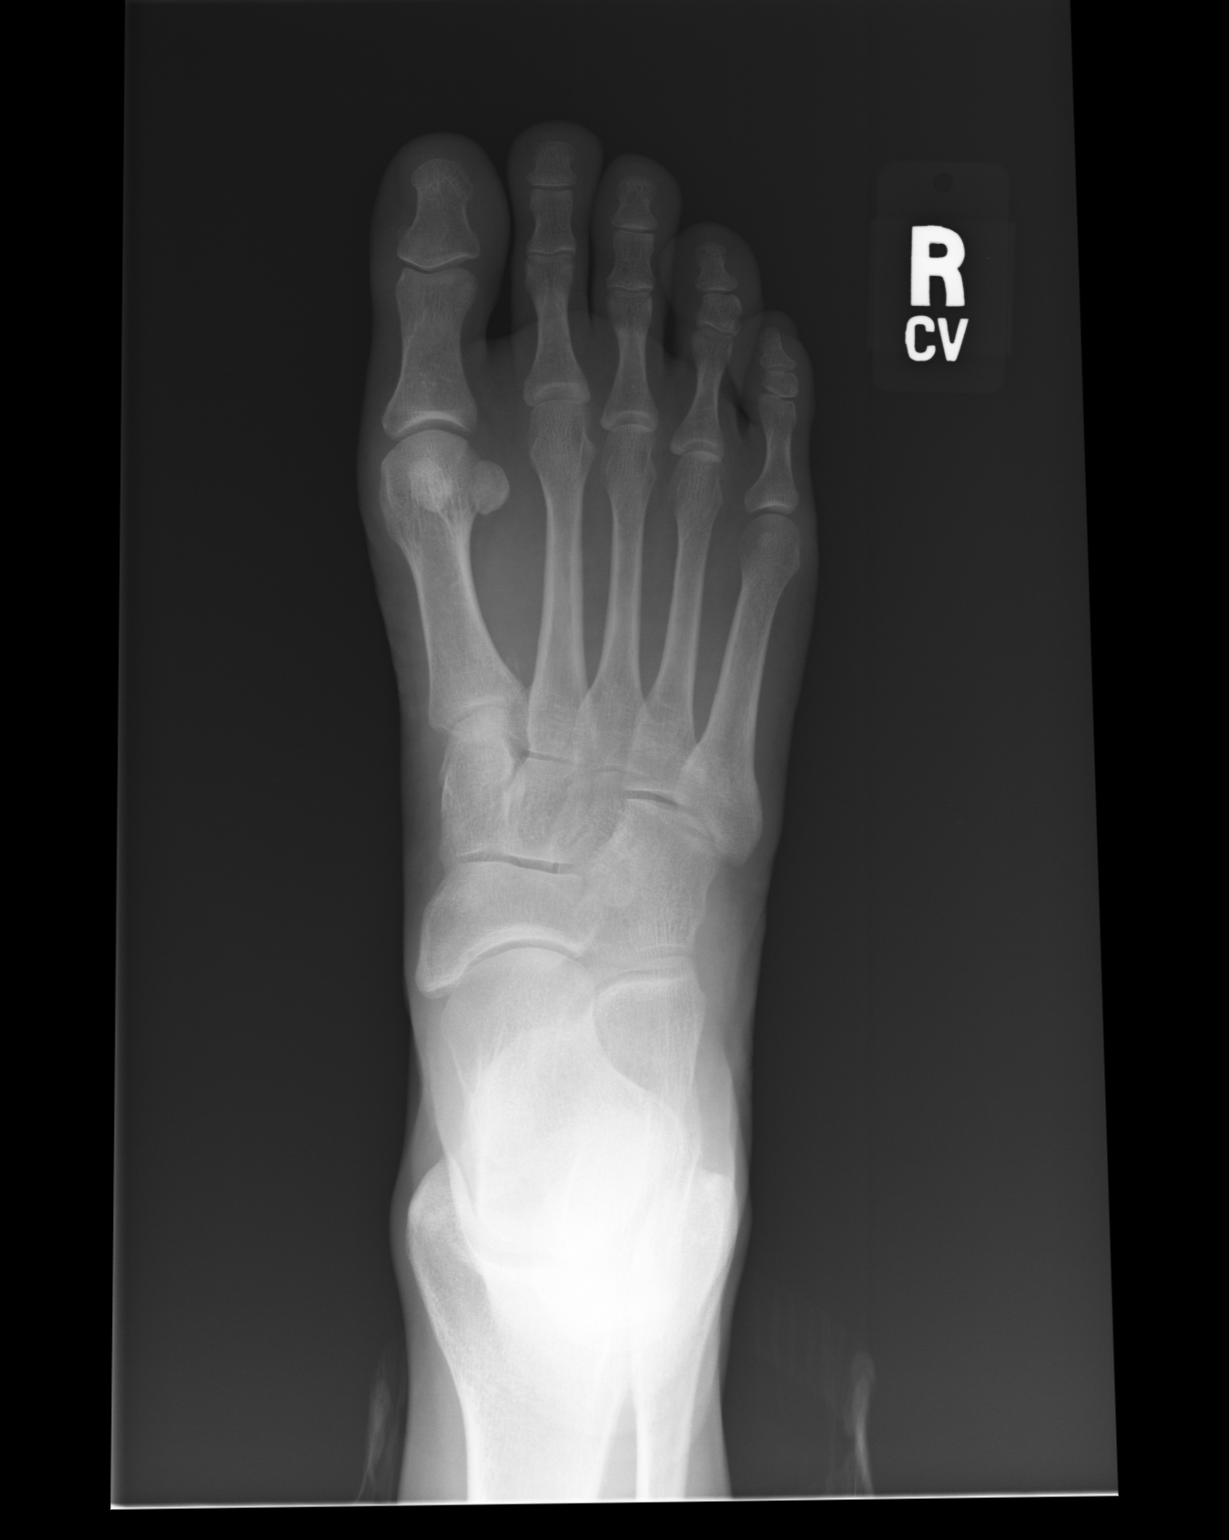
[im 2/3]
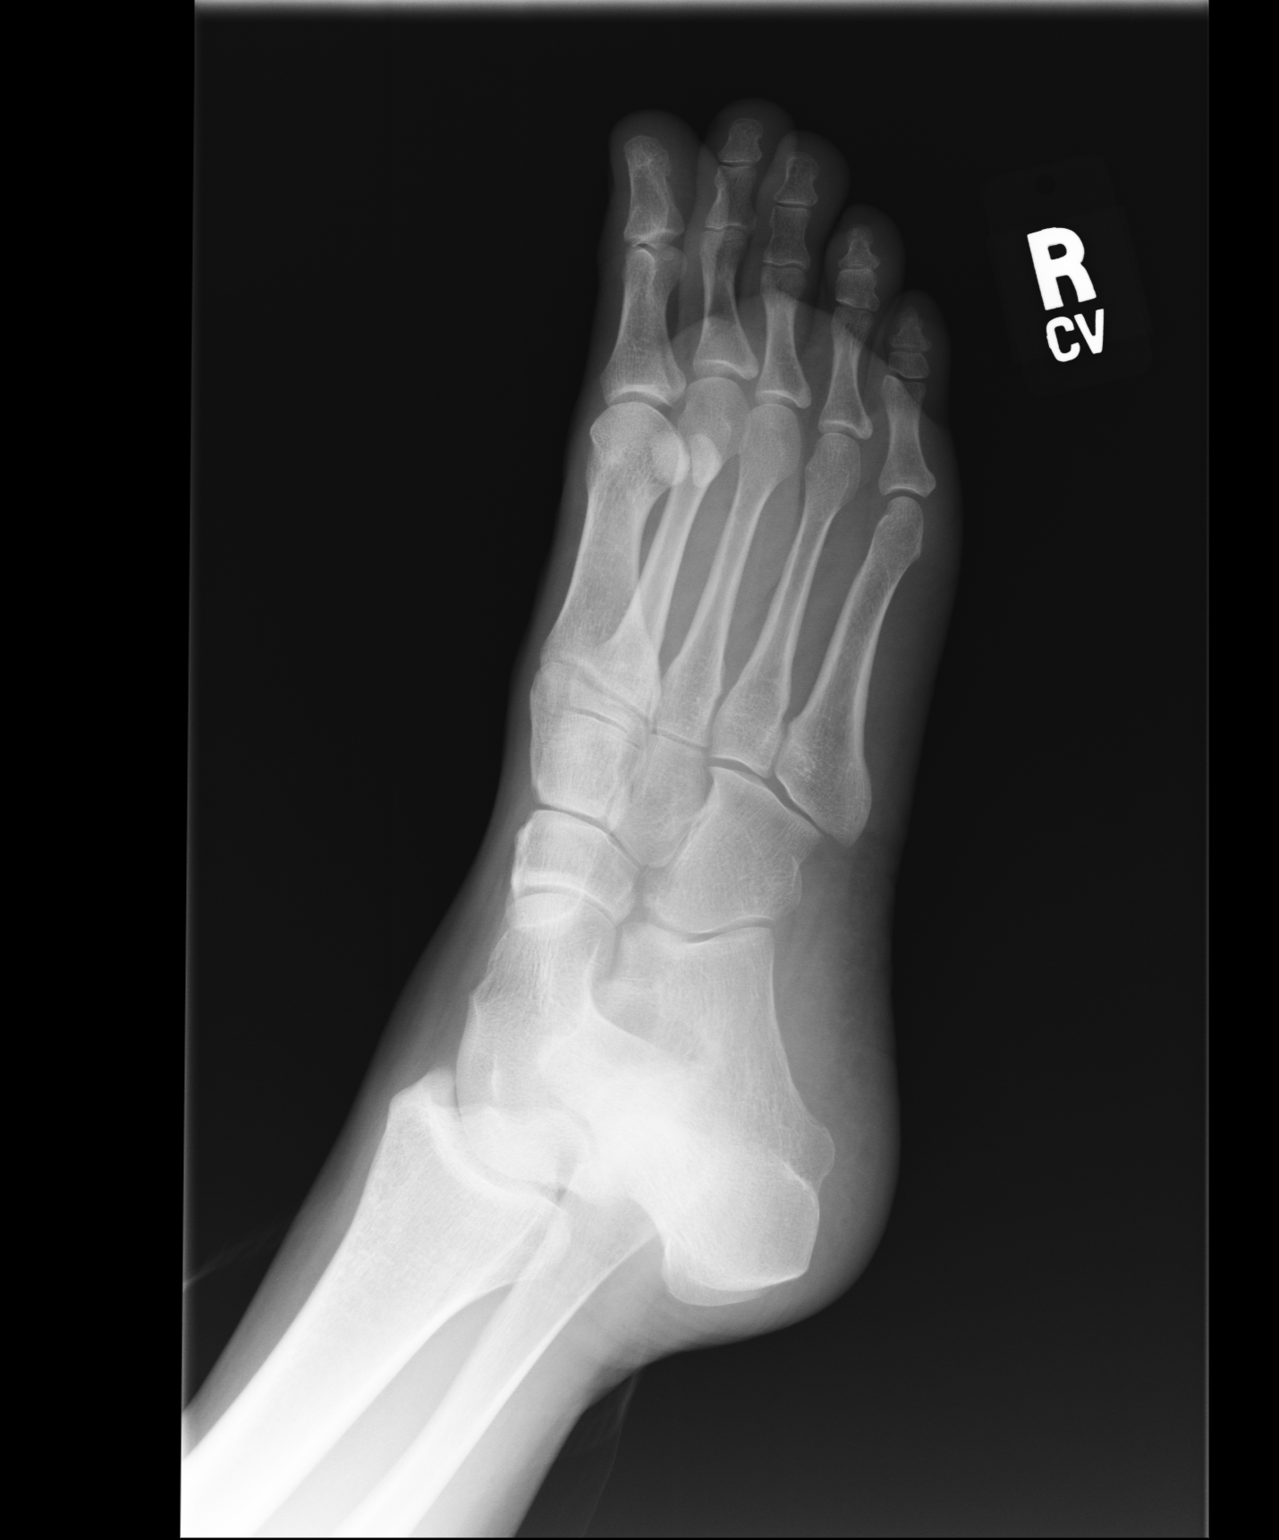
[im 3/3]
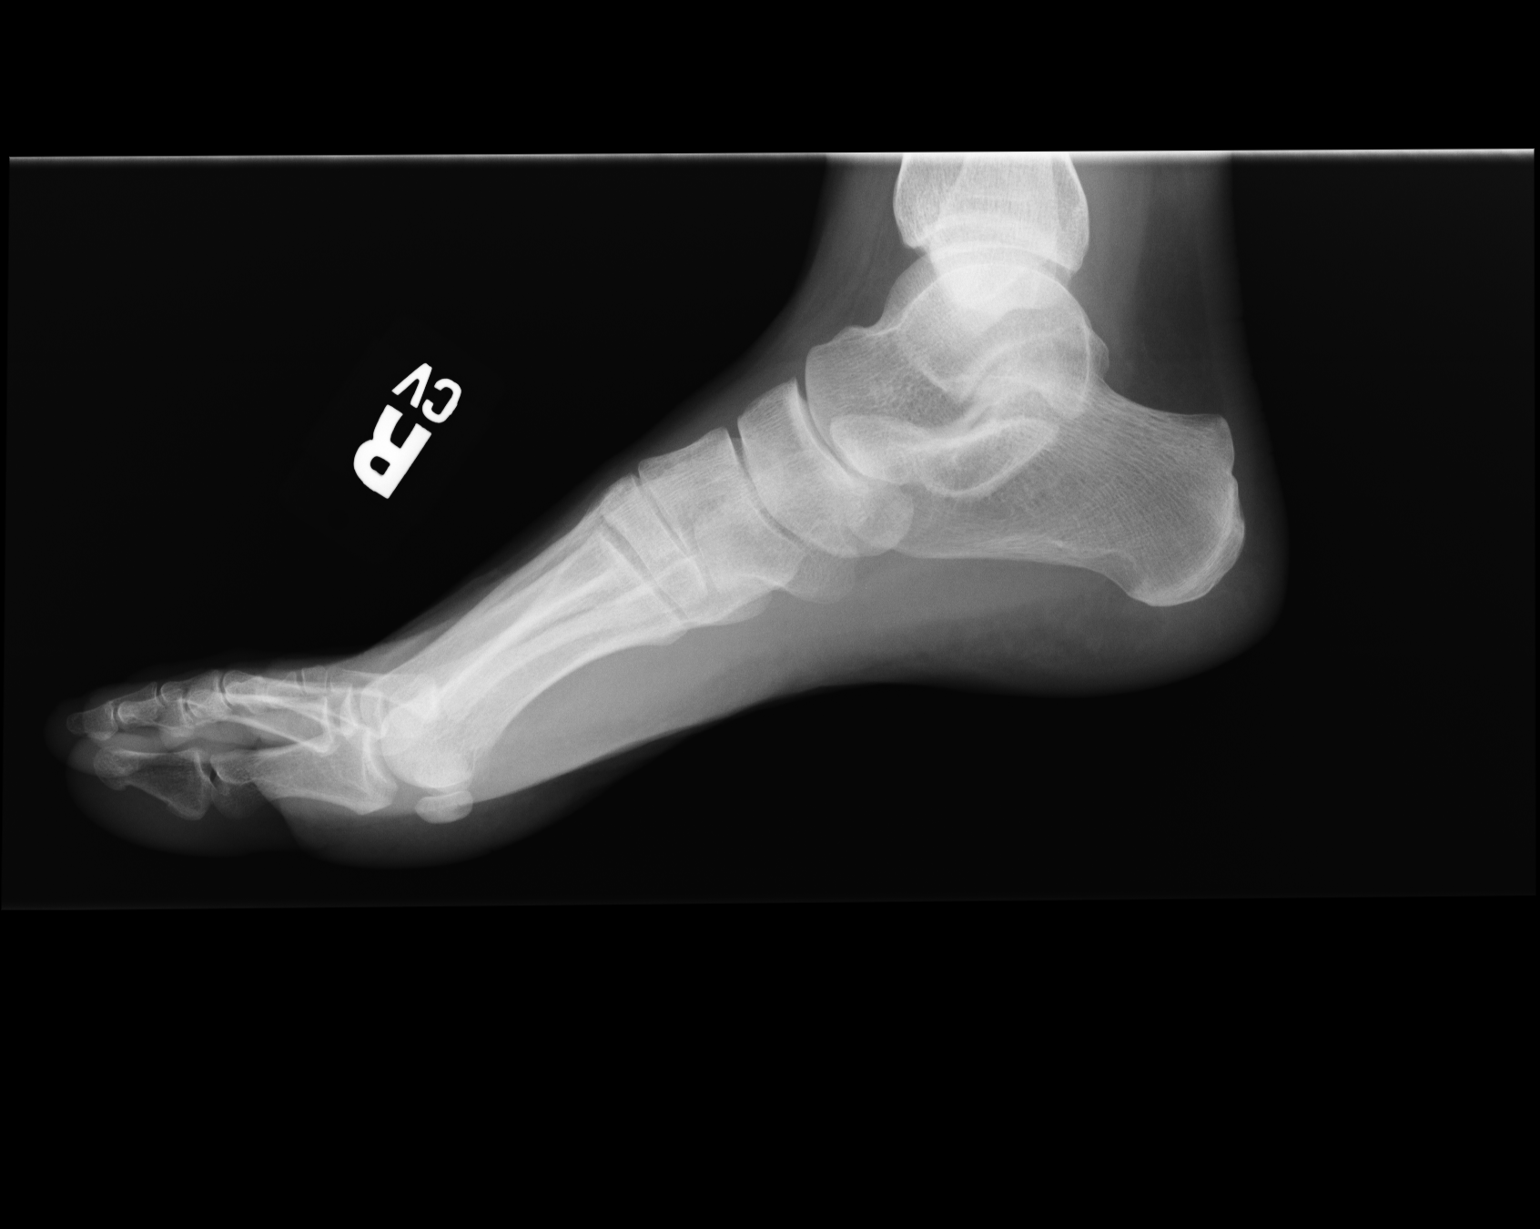

[3 of 3 positions shown; findings below may reference images not displayed]

FINDINGS: No acute fracture. Normal alignment. Joint spaces are maintained.
IMPRESSION: No acute fracture.

## 2011-08-01 IMAGING — NM NM BONE LIMITED
1 series · 6 of 6 positions shown · non-contrast
Comparison: none

REASON FOR EXAM: Rib Pain
COMMENTS:

[Series 1000: bone statics · 2.40mm/px · 3 acquisitions, 6 frames shown]
[im 1/3]
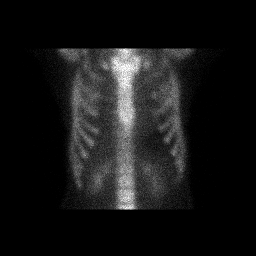
[im 1/3]
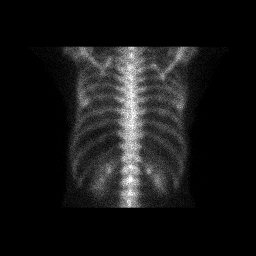
[im 2/3]
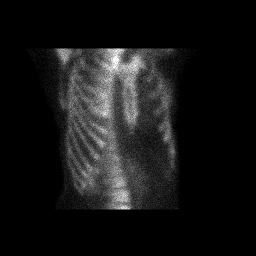
[im 2/3]
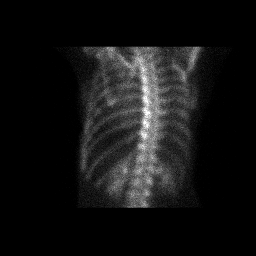
[im 3/3]
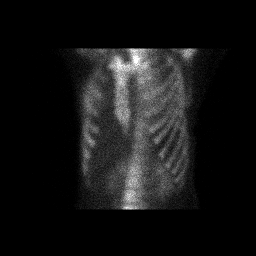
[im 3/3]
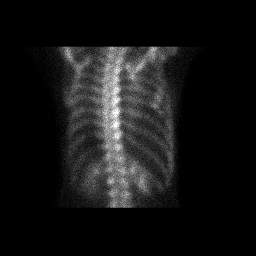

[6 of 6 positions shown; findings below may reference images not displayed]

PROCEDURE:     NM  - NM LIMITED BONE SCAN SINGLE AREA  - September 09, 2009  [DATE]

RESULT:     Following intravenous administration of 23.81 mCi technetium 99m
MDP, a limited bone scan of the thorax and ribs was performed. Each scapula
is for the most part included as is the sternum. There is observed a normal
distribution of tracer activity. Specifically, no abnormal focal area of
increased tracer activity in the ribs, thoracic spine or sternum is seen.
IMPRESSION: 1. Normal study.
2. Tracer activity is visualized in both kidneys.

## 2011-10-23 ENCOUNTER — Encounter: Payer: Self-pay | Admitting: Family Medicine

## 2011-10-23 ENCOUNTER — Telehealth: Payer: Self-pay

## 2011-10-23 ENCOUNTER — Ambulatory Visit (INDEPENDENT_AMBULATORY_CARE_PROVIDER_SITE_OTHER): Payer: BC Managed Care – PPO | Admitting: Family Medicine

## 2011-10-23 VITALS — BP 112/70 | HR 58 | Temp 98.6°F | Resp 16 | Ht 65.0 in | Wt 135.0 lb

## 2011-10-23 DIAGNOSIS — R5383 Other fatigue: Secondary | ICD-10-CM

## 2011-10-23 DIAGNOSIS — R5381 Other malaise: Secondary | ICD-10-CM

## 2011-10-23 DIAGNOSIS — M2550 Pain in unspecified joint: Secondary | ICD-10-CM

## 2011-10-23 DIAGNOSIS — R609 Edema, unspecified: Secondary | ICD-10-CM

## 2011-10-23 LAB — POCT UA - MICROSCOPIC ONLY: RBC, urine, microscopic: NEGATIVE

## 2011-10-23 LAB — POCT URINALYSIS DIPSTICK
Blood, UA: NEGATIVE
Glucose, UA: NEGATIVE
Ketones, UA: NEGATIVE
Spec Grav, UA: 1.02

## 2011-10-23 NOTE — Patient Instructions (Addendum)
1. Swelling  POCT UA - Microscopic Only  2. Arthralgia  Ambulatory referral to Rheumatology  3. Fatigue

## 2011-10-23 NOTE — Telephone Encounter (Signed)
DR Katrinka Blazing,   PT FORGOT TO ASK YOU IF YOU WANTED HER TO GO TO ORTHO WITH WRIST OR DID YOU JUST WANT HER TO GO TO RHEUMATOLOGIST?   PLEASE ADVISE [phone]

## 2011-10-23 NOTE — Telephone Encounter (Signed)
Rheumatology, referral made by Dr Katrinka Blazing.

## 2011-10-23 NOTE — Progress Notes (Signed)
[address]   [place], [place]  [address]   [id number]  Subjective:    Patient ID: Kristen Wu, female    DOB: April 24, 1975, 36 y.o.   MRN: [id number]  HPIThis 36 y.o. female presents for evaluation of the following:  1.  Swelling:  Legs and arms were swelling a few weeks ago.  Hands swelling also.  Very hard to work.  In past three days, feeling a lot better.  3rd-5th digits on R hand tingling.    2.  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 If has longer shifts, feels horribly.  One month ago, had to work 4-5 hours; after one hour, went to car and fell asleep.  Get home and crashes.  However, has felt better in past three days.  Sleeping well; had Adderall filled last July 22.  Fatigue much improved in past 3 days but still has not returned to baseline.  Last night, made self go outside to spend time with children.  Has suffered with fatigue for quite a while.    3.  Arthralgias: shoulders, hips; will wake pt up at night; stabbing pain wakes pt up.  Throbbing.  Worried about autoimmune process; made appointment at [facility] who pt liked; wanted labs.  TSH normal, ANA normal; ESR 2; RF negative.  CBC normal, CMET normal; Lyme's titers negative.  B12 slightly low.  Has been taking B12.  Vitamin D slightly low.   No recent URI symptoms or GI illness.  No new medications; no foreign travel.  Cousins with MS; worried about MS.  Dropping a lot of things.  Trains people at pharmacy.   Nadine Counts offered appointment with Elease Hashimoto.  Taking Ibuprofen 800mg  tid.    4.  ADHD: suffers with insomnia on Adderall XR; had 5mg  filled; sleeps better.    5.  Anxiety:  Much better; no major stressors; things are good; work is good.  Has been down two people at work; working a lot harder; stress is manageable at this time.   6.  RUE spot on arm: very sensitive/panful to touch.  Large.  No rash.  No swelling.  No induration.  Duration 1 week.      Review of Systems  Constitutional: Positive for activity change and fatigue. Negative for fever, chills, diaphoresis, appetite change and unexpected weight change.  HENT: Negative for hearing loss, ear pain, congestion, rhinorrhea, postnasal drip and tinnitus.   Eyes: Negative for photophobia, redness and visual disturbance.  Respiratory: Negative for cough, choking, chest tightness, shortness of breath, wheezing and stridor.   Cardiovascular: Positive for leg swelling. Negative for chest pain and palpitations.  Musculoskeletal: Positive for joint swelling and arthralgias. Negative for back pain.  Skin: Negative for color change, pallor, rash and wound.  Neurological: Negative for dizziness, syncope, weakness, light-headedness, numbness and headaches.  Hematological: Negative for adenopathy. Does not bruise/bleed easily.  Psychiatric/Behavioral: Positive for decreased concentration. Negative for suicidal ideas, sleep disturbance, self-injury and dysphoric mood. The patient is hyperactive. The patient is not nervous/anxious.         Past Medical History  Diagnosis Date  . Mitral valve prolapse   . Abnormal Pap smear   . Asthma   . Anxiety   . Nephrolithiasis   . Allergy     Past Surgical History  Procedure Date  . Colposcopy   . Laparoscopy     QUESTIONABLE ECTOPIC   . Knee  surgery     right    Prior to Admission medications   Medication Sig Start Date End Date Taking? Authorizing Provider  amphetamine-dextroamphetamine (ADDERALL XR) 20 MG 24 hr capsule Take 20 mg by mouth every morning.   Yes Historical Provider, MD  levocetirizine (XYZAL) 5 MG tablet Take 5 mg by mouth every evening.   Yes Historical Provider, MD  levonorgestrel (MIRENA) 20 MCG/24HR IUD 1 each by Intrauterine route once.   Yes Historical Provider, MD  sertraline (ZOLOFT) 50 MG tablet Take 50 mg by mouth daily.   Yes Historical Provider, MD  tiZANidine (ZANAFLEX) 4 MG capsule Take 4 mg by mouth at  bedtime as needed.    Yes Historical Provider, MD  lisdexamfetamine (VYVANSE) 40 MG capsule Take 40 mg by mouth every morning.    Historical Provider, MD    Allergies  Allergen Reactions  . Sulfa Drugs Cross Reactors Rash    History   Social History  . Marital Status: Married    Spouse Name: N/A    Number of Children: N/A  . Years of Education: N/A   Occupational History  . Not on file.   Social History Main Topics  . Smoking status: Never Smoker   . Smokeless tobacco: Not on file  . Alcohol Use: 0.0 oz/week    1-2 drink(s) per week     Comment: occassionally  . Drug Use: No  . Sexually Active: Yes -- Female partner(s)    Birth Control/ Protection: IUD   Other Topics Concern  . Not on file   Social History Narrative  . No narrative on file    Family History  Problem Relation Age of Onset  . Hypertension Father   . Arthritis Father   . Heart disease Father   . Hyperlipidemia Father   . Cancer Maternal Grandmother     BREAST   . Hypertension Maternal Grandmother   . Hypertension Maternal Grandfather   . Depression Mother   . Asthma Brother   . Asthma Daughter   . Cancer Maternal Uncle     stomach  . Heart disease Paternal Uncle     heart attack  . Hypertension Paternal Uncle   . Hyperlipidemia Paternal Uncle   . Cancer Paternal Grandfather     prostate  . Heart disease Paternal Grandfather     heart attack    Objective:   Physical Exam  Nursing note and vitals reviewed. Constitutional: She is oriented to person, place, and time. She appears well-developed and well-nourished. No distress.  HENT:  Head: Normocephalic and atraumatic.  Right Ear: External ear normal.  Left Ear: External ear normal.  Nose: Nose normal.  Mouth/Throat: Oropharynx is clear and moist.  Eyes: Conjunctivae normal and EOM are normal. Pupils are equal, round, and reactive to light.  Neck: Normal range of motion. Neck supple. No JVD present. No thyromegaly present.   Cardiovascular: Normal rate, regular rhythm, normal heart sounds and intact distal pulses.  Exam reveals no gallop and no friction rub.   No murmur heard. Pulmonary/Chest: Effort normal and breath sounds normal. She has no wheezes. She has no rales.  Abdominal: Soft. Bowel sounds are normal.  Musculoskeletal:       Right shoulder: She exhibits normal range of motion, no tenderness and no bony tenderness.       Left shoulder: She exhibits normal range of motion, no tenderness and no bony tenderness.       Right knee: She exhibits normal range of motion  and no swelling. no tenderness found.       Left knee: She exhibits normal range of motion and no swelling. no tenderness found.       Right ankle: She exhibits normal range of motion and no swelling.       Left ankle: She exhibits normal range of motion and no swelling.       Cervical back: She exhibits normal range of motion, no tenderness and no bony tenderness.       Thoracic back: She exhibits normal range of motion, no tenderness and no bony tenderness.       Right hand: She exhibits normal range of motion, no tenderness and no swelling.       Left hand: She exhibits normal range of motion, no tenderness and no swelling.       Right foot: She exhibits normal range of motion, no tenderness, no bony tenderness and no swelling.       Left foot: She exhibits normal range of motion, no tenderness, no bony tenderness and no swelling.  Lymphadenopathy:    She has no cervical adenopathy.  Neurological: She is alert and oriented to person, place, and time. No cranial nerve deficit. She exhibits normal muscle tone. Coordination normal.  Skin: Skin is warm and dry. No rash noted. She is not diaphoretic. No erythema. No pallor.  Psychiatric: She has a normal mood and affect. Her behavior is normal. Judgment and thought content normal.    Results for orders placed in visit on 10/23/11  POCT UA - MICROSCOPIC ONLY      Component Value Range   WBC,  Ur, HPF, POC 0-7     RBC, urine, microscopic neg     Bacteria, U Microscopic trace     Mucus, UA small     Epithelial cells, urine per micros 0-6     Crystals, Ur, HPF, POC neg     Casts, Ur, LPF, POC neg     Yeast, UA neg    POCT URINALYSIS DIPSTICK      Component Value Range   Color, UA yellow     Clarity, UA slightly cloudy     Glucose, UA neg     Bilirubin, UA neg     Ketones, UA neg     Spec Grav, UA 1.020     Blood, UA neg     pH, UA 7.0     Protein, UA neg     Urobilinogen, UA 0.2     Nitrite, UA neg     Leukocytes, UA small (1+)         Assessment & Plan:   1. Swelling  POCT UA - Microscopic Only  2. Arthralgia  Ambulatory referral to Rheumatology  3. Fatigue  POCT urinalysis dipstick     1. Swelling:  New.  Now improved/resolved.  Onset with arthralgias.  Recent labs reviewed from [facility] and all normal; urine normal today; no evidence of proteinuria.  Concerning for underlying autoimmune process.   2.  Arthralgias:  New.  Multiple joints involved.  Reviewed labs from [facility]; all negative; will contact [facility] to add CK level to recent labs.  Refer to rheumatology to rule out autoimmune process.  Improving slowly. 3.  Fatigue:  New. Associated with arthralgias, joint and limb swelling.  Recent labs reviewed from [facility].  Urine negative.

## 2011-10-25 ENCOUNTER — Encounter: Payer: Self-pay | Admitting: Family Medicine

## 2011-10-26 ENCOUNTER — Telehealth: Payer: Self-pay

## 2011-10-26 NOTE — Telephone Encounter (Addendum)
Dr.Smith, Pt would like to let you know that rheumatoid arthritis does run in her family both maternal and parenal, also MS runs on her mothers side. Best# [phone]

## 2011-10-27 NOTE — Telephone Encounter (Signed)
lmom 

## 2011-10-27 NOTE — Telephone Encounter (Signed)
Below noted; please advise patient that I received message.  KMS

## 2012-01-04 NOTE — Progress Notes (Signed)
Reviewed and agree.

## 2012-01-13 ENCOUNTER — Other Ambulatory Visit: Payer: Self-pay | Admitting: Family Medicine

## 2012-01-14 ENCOUNTER — Other Ambulatory Visit: Payer: Self-pay | Admitting: Family Medicine

## 2012-01-14 NOTE — Telephone Encounter (Signed)
Forward to Dr. Katrinka Blazing.  You've only seen this patient once here, but I'm not sure if you are her PCP from before.  Please advise.

## 2012-01-15 ENCOUNTER — Telehealth: Payer: Self-pay

## 2012-01-15 NOTE — Telephone Encounter (Signed)
Rx called in Per Dr Katrinka Blazing.  Ambien 10 mg tablet, take 1 tablet at bedtime, #30 RF x 5.

## 2012-03-15 ENCOUNTER — Other Ambulatory Visit: Payer: Self-pay | Admitting: Family Medicine

## 2012-03-17 NOTE — Telephone Encounter (Signed)
Forward to Dr. Katrinka Blazing

## 2012-03-24 MED ORDER — MONTELUKAST SODIUM 10 MG PO TABS: 10.0000 mg | ORAL_TABLET | Freq: Every day | ORAL | Status: DC

## 2012-03-24 NOTE — Addendum Note (Signed)
Addended by: Ethelda Chick on: 03/24/2012 03:18 PM   Modules accepted: Orders

## 2012-12-26 ENCOUNTER — Encounter: Payer: Self-pay | Admitting: Family Medicine

## 2012-12-26 ENCOUNTER — Ambulatory Visit (INDEPENDENT_AMBULATORY_CARE_PROVIDER_SITE_OTHER): Payer: BC Managed Care – PPO | Admitting: Family Medicine

## 2012-12-26 VITALS — BP 117/79 | HR 51 | Ht 66.0 in | Wt 148.0 lb

## 2012-12-26 DIAGNOSIS — R5383 Other fatigue: Secondary | ICD-10-CM

## 2012-12-26 DIAGNOSIS — Z01419 Encounter for gynecological examination (general) (routine) without abnormal findings: Secondary | ICD-10-CM

## 2012-12-26 DIAGNOSIS — R5381 Other malaise: Secondary | ICD-10-CM

## 2012-12-26 DIAGNOSIS — Z124 Encounter for screening for malignant neoplasm of cervix: Secondary | ICD-10-CM

## 2012-12-26 DIAGNOSIS — Z1151 Encounter for screening for human papillomavirus (HPV): Secondary | ICD-10-CM

## 2012-12-26 NOTE — Patient Instructions (Signed)
Preventive Care for Adults, Female A healthy lifestyle and preventive care can promote health and wellness. Preventive health guidelines for women include the following key practices.  A routine yearly physical is a good way to check with your caregiver about your health and preventive screening. It is a chance to share any concerns and updates on your health, and to receive a thorough exam.  Visit your dentist for a routine exam and preventive care every 6 months. Brush your teeth twice a day and floss once a day. Good oral hygiene prevents tooth decay and gum disease.  The frequency of eye exams is based on your age, health, family medical history, use of contact lenses, and other factors. Follow your caregiver's recommendations for frequency of eye exams.  Eat a healthy diet. Foods like vegetables, fruits, whole grains, low-fat dairy products, and lean protein foods contain the nutrients you need without too many calories. Decrease your intake of foods high in solid fats, added sugars, and salt. Eat the right amount of calories for you.Get information about a proper diet from your caregiver, if necessary.  Regular physical exercise is one of the most important things you can do for your health. Most adults should get at least 150 minutes of moderate-intensity exercise (any activity that increases your heart rate and causes you to sweat) each week. In addition, most adults need muscle-strengthening exercises on 2 or more days a week.  Maintain a healthy weight. The body mass index (BMI) is a screening tool to identify possible weight problems. It provides an estimate of body fat based on height and weight. Your caregiver can help determine your BMI, and can help you achieve or maintain a healthy weight.For adults 20 years and older:  A BMI below 18.5 is considered underweight.  A BMI of 18.5 to 24.9 is normal.  A BMI of 25 to 29.9 is considered overweight.  A BMI of 30 and above is  considered obese.  Maintain normal blood lipids and cholesterol levels by exercising and minimizing your intake of saturated fat. Eat a balanced diet with plenty of fruit and vegetables. Blood tests for lipids and cholesterol should begin at age 20 and be repeated every 5 years. If your lipid or cholesterol levels are high, you are over 50, or you are at high risk for heart disease, you may need your cholesterol levels checked more frequently.Ongoing high lipid and cholesterol levels should be treated with medicines if diet and exercise are not effective.  If you smoke, find out from your caregiver how to quit. If you do not use tobacco, do not start.  Lung cancer screening is recommended for adults aged 55 80 years who are at high risk for developing lung cancer because of a history of smoking. Yearly low-dose computed tomography (CT) is recommended for people who have at least a 30-pack-year history of smoking and are a current smoker or have quit within the past 15 years. A pack year of smoking is smoking an average of 1 pack of cigarettes a day for 1 year (for example: 1 pack a day for 30 years or 2 packs a day for 15 years). Yearly screening should continue until the smoker has stopped smoking for at least 15 years. Yearly screening should also be stopped for people who develop a health problem that would prevent them from having lung cancer treatment.  If you are pregnant, do not drink alcohol. If you are breastfeeding, be very cautious about drinking alcohol. If you are   not pregnant and choose to drink alcohol, do not exceed 1 drink per day. One drink is considered to be 12 ounces (355 mL) of beer, 5 ounces (148 mL) of wine, or 1.5 ounces (44 mL) of liquor.  Avoid use of street drugs. Do not share needles with anyone. Ask for help if you need support or instructions about stopping the use of drugs.  High blood pressure causes heart disease and increases the risk of stroke. Your blood pressure  should be checked at least every 1 to 2 years. Ongoing high blood pressure should be treated with medicines if weight loss and exercise are not effective.  If you are 55 to 38 years old, ask your caregiver if you should take aspirin to prevent strokes.  Diabetes screening involves taking a blood sample to check your fasting blood sugar level. This should be done once every 3 years, after age 45, if you are within normal weight and without risk factors for diabetes. Testing should be considered at a younger age or be carried out more frequently if you are overweight and have at least 1 risk factor for diabetes.  Breast cancer screening is essential preventive care for women. You should practice "breast self-awareness." This means understanding the normal appearance and feel of your breasts and may include breast self-examination. Any changes detected, no matter how small, should be reported to a caregiver. Women in their 20s and 30s should have a clinical breast exam (CBE) by a caregiver as part of a regular health exam every 1 to 3 years. After age 40, women should have a CBE every year. Starting at age 40, women should consider having a mammography (breast X-ray test) every year. Women who have a family history of breast cancer should talk to their caregiver about genetic screening. Women at a high risk of breast cancer should talk to their caregivers about having magnetic resonance imaging (MRI) and a mammography every year.  Breast cancer gene (BRCA)-related cancer risk assessment is recommended for women who have family members with BRCA-related cancers. BRCA-related cancers include breast, ovarian, tubal, and peritoneal cancers. Having family members with these cancers may be associated with an increased risk for harmful changes (mutations) in the breast cancer genes BRCA1 and BRCA2. Results of the assessment will determine the need for genetic counseling and BRCA1 and BRCA2 testing.  The Pap test is  a screening test for cervical cancer. A Pap test can show cell changes on the cervix that might become cervical cancer if left untreated. A Pap test is a procedure in which cells are obtained and examined from the lower end of the uterus (cervix).  Women should have a Pap test starting at age 21.  Between ages 21 and 29, Pap tests should be repeated every 2 years.  Beginning at age 30, you should have a Pap test every 3 years as long as the past 3 Pap tests have been normal.  Some women have medical problems that increase the chance of getting cervical cancer. Talk to your caregiver about these problems. It is especially important to talk to your caregiver if a new problem develops soon after your last Pap test. In these cases, your caregiver may recommend more frequent screening and Pap tests.  The above recommendations are the same for women who have or have not gotten the vaccine for human papillomavirus (HPV).  If you had a hysterectomy for a problem that was not cancer or a condition that could lead to cancer, then   you no longer need Pap tests. Even if you no longer need a Pap test, a regular exam is a good idea to make sure no other problems are starting.  If you are between ages 65 and 70, and you have had normal Pap tests going back 10 years, you no longer need Pap tests. Even if you no longer need a Pap test, a regular exam is a good idea to make sure no other problems are starting.  If you have had past treatment for cervical cancer or a condition that could lead to cancer, you need Pap tests and screening for cancer for at least 20 years after your treatment.  If Pap tests have been discontinued, risk factors (such as a new sexual partner) need to be reassessed to determine if screening should be resumed.  The HPV test is an additional test that may be used for cervical cancer screening. The HPV test looks for the virus that can cause the cell changes on the cervix. The cells collected  during the Pap test can be tested for HPV. The HPV test could be used to screen women aged 30 years and older, and should be used in women of any age who have unclear Pap test results. After the age of 30, women should have HPV testing at the same frequency as a Pap test.  Colorectal cancer can be detected and often prevented. Most routine colorectal cancer screening begins at the age of 50 and continues through age 75. However, your caregiver may recommend screening at an earlier age if you have risk factors for colon cancer. On a yearly basis, your caregiver may provide home test kits to check for hidden blood in the stool. Use of a small camera at the end of a tube, to directly examine the colon (sigmoidoscopy or colonoscopy), can detect the earliest forms of colorectal cancer. Talk to your caregiver about this at age 50, when routine screening begins. Direct examination of the colon should be repeated every 5 to 10 years through age 75, unless early forms of pre-cancerous polyps or small growths are found.  Hepatitis C blood testing is recommended for all people born from 1945 through 1965 and any individual with known risks for hepatitis C.  Practice safe sex. Use condoms and avoid high-risk sexual practices to reduce the spread of sexually transmitted infections (STIs). STIs include gonorrhea, chlamydia, syphilis, trichomonas, herpes, HPV, and human immunodeficiency virus (HIV). Herpes, HIV, and HPV are viral illnesses that have no cure. They can result in disability, cancer, and death. Sexually active women aged 25 and younger should be checked for chlamydia. Older women with new or multiple partners should also be tested for chlamydia. Testing for other STIs is recommended if you are sexually active and at increased risk.  Osteoporosis is a disease in which the bones lose minerals and strength with aging. This can result in serious bone fractures. The risk of osteoporosis can be identified using a  bone density scan. Women ages 65 and over and women at risk for fractures or osteoporosis should discuss screening with their caregivers. Ask your caregiver whether you should take a calcium supplement or vitamin D to reduce the rate of osteoporosis.  Menopause can be associated with physical symptoms and risks. Hormone replacement therapy is available to decrease symptoms and risks. You should talk to your caregiver about whether hormone replacement therapy is right for you.  Use sunscreen. Apply sunscreen liberally and repeatedly throughout the day. You should seek shade   when your shadow is shorter than you. Protect yourself by wearing long sleeves, pants, a wide-brimmed hat, and sunglasses year round, whenever you are outdoors.  Once a month, do a whole body skin exam, using a mirror to look at the skin on your back. Notify your caregiver of new moles, moles that have irregular borders, moles that are larger than a pencil eraser, or moles that have changed in shape or color.  Stay current with required immunizations.  Influenza vaccine. All adults should be immunized every year.  Tetanus, diphtheria, and acellular pertussis (Td, Tdap) vaccine. Pregnant women should receive 1 dose of Tdap vaccine during each pregnancy. The dose should be obtained regardless of the length of time since the last dose. Immunization is preferred during the 27th to 36th week of gestation. An adult who has not previously received Tdap or who does not know her vaccine status should receive 1 dose of Tdap. This initial dose should be followed by tetanus and diphtheria toxoids (Td) booster doses every 10 years. Adults with an unknown or incomplete history of completing a 3-dose immunization series with Td-containing vaccines should begin or complete a primary immunization series including a Tdap dose. Adults should receive a Td booster every 10 years.  Varicella vaccine. An adult without evidence of immunity to varicella  should receive 2 doses or a second dose if she has previously received 1 dose. Pregnant females who do not have evidence of immunity should receive the first dose after pregnancy. This first dose should be obtained before leaving the health care facility. The second dose should be obtained 4 8 weeks after the first dose.  Human papillomavirus (HPV) vaccine. Females aged 13 26 years who have not received the vaccine previously should obtain the 3-dose series. The vaccine is not recommended for use in pregnant females. However, pregnancy testing is not needed before receiving a dose. If a female is found to be pregnant after receiving a dose, no treatment is needed. In that case, the remaining doses should be delayed until after the pregnancy. Immunization is recommended for any person with an immunocompromised condition through the age of 26 years if she did not get any or all doses earlier. During the 3-dose series, the second dose should be obtained 4 8 weeks after the first dose. The third dose should be obtained 24 weeks after the first dose and 16 weeks after the second dose.  Zoster vaccine. One dose is recommended for adults aged 60 years or older unless certain conditions are present.  Measles, mumps, and rubella (MMR) vaccine. Adults born before 1957 generally are considered immune to measles and mumps. Adults born in 1957 or later should have 1 or more doses of MMR vaccine unless there is a contraindication to the vaccine or there is laboratory evidence of immunity to each of the three diseases. A routine second dose of MMR vaccine should be obtained at least 28 days after the first dose for students attending postsecondary schools, health care workers, or international travelers. People who received inactivated measles vaccine or an unknown type of measles vaccine during 1963 1967 should receive 2 doses of MMR vaccine. People who received inactivated mumps vaccine or an unknown type of mumps vaccine  before 1979 and are at high risk for mumps infection should consider immunization with 2 doses of MMR vaccine. For females of childbearing age, rubella immunity should be determined. If there is no evidence of immunity, females who are not pregnant should be vaccinated. If there   is no evidence of immunity, females who are pregnant should delay immunization until after pregnancy. Unvaccinated health care workers born before 1957 who lack laboratory evidence of measles, mumps, or rubella immunity or laboratory confirmation of disease should consider measles and mumps immunization with 2 doses of MMR vaccine or rubella immunization with 1 dose of MMR vaccine.  Pneumococcal 13-valent conjugate (PCV13) vaccine. When indicated, a person who is uncertain of her immunization history and has no record of immunization should receive the PCV13 vaccine. An adult aged 19 years or older who has certain medical conditions and has not been previously immunized should receive 1 dose of PCV13 vaccine. This PCV13 should be followed with a dose of pneumococcal polysaccharide (PPSV23) vaccine. The PPSV23 vaccine dose should be obtained at least 8 weeks after the dose of PCV13 vaccine. An adult aged 19 years or older who has certain medical conditions and previously received 1 or more doses of PPSV23 vaccine should receive 1 dose of PCV13. The PCV13 vaccine dose should be obtained 1 or more years after the last PPSV23 vaccine dose.  Pneumococcal polysaccharide (PPSV23) vaccine. When PCV13 is also indicated, PCV13 should be obtained first. All adults aged 65 years and older should be immunized. An adult younger than age 65 years who has certain medical conditions should be immunized. Any person who resides in a nursing home or long-term care facility should be immunized. An adult smoker should be immunized. People with an immunocompromised condition and certain other conditions should receive both PCV13 and PPSV23 vaccines. People  with human immunodeficiency virus (HIV) infection should be immunized as soon as possible after diagnosis. Immunization during chemotherapy or radiation therapy should be avoided. Routine use of PPSV23 vaccine is not recommended for American Indians, [place] Natives, or people younger than 65 years unless there are medical conditions that require PPSV23 vaccine. When indicated, people who have unknown immunization and have no record of immunization should receive PPSV23 vaccine. One-time revaccination 5 years after the first dose of PPSV23 is recommended for people aged 19 64 years who have chronic kidney failure, nephrotic syndrome, asplenia, or immunocompromised conditions. People who received 1 2 doses of PPSV23 before age 65 years should receive another dose of PPSV23 vaccine at age 65 years or later if at least 5 years have passed since the previous dose. Doses of PPSV23 are not needed for people immunized with PPSV23 at or after age 65 years.  Meningococcal vaccine. Adults with asplenia or persistent complement component deficiencies should receive 2 doses of quadrivalent meningococcal conjugate (MenACWY-D) vaccine. The doses should be obtained at least 2 months apart. Microbiologists working with certain meningococcal bacteria, military recruits, people at risk during an outbreak, and people who travel to or live in countries with a high rate of meningitis should be immunized. A first-year college student up through age 21 years who is living in a residence hall should receive a dose if she did not receive a dose on or after her 16th birthday. Adults who have certain high-risk conditions should receive one or more doses of vaccine.  Hepatitis A vaccine. Adults who wish to be protected from this disease, have certain high-risk conditions, work with hepatitis A-infected animals, work in hepatitis A research labs, or travel to or work in countries with a high rate of hepatitis A should be immunized. Adults  who were previously unvaccinated and who anticipate close contact with an international adoptee during the first 60 days after arrival in the [place] from a country   with a high rate of hepatitis A should be immunized.  Hepatitis B vaccine. Adults who wish to be protected from this disease, have certain high-risk conditions, may be exposed to blood or other infectious body fluids, are household contacts or sex partners of hepatitis B positive people, are clients or workers in certain care facilities, or travel to or work in countries with a high rate of hepatitis B should be immunized.  Haemophilus influenzae type b (Hib) vaccine. A previously unvaccinated person with asplenia or sickle cell disease or having a scheduled splenectomy should receive 1 dose of Hib vaccine. Regardless of previous immunization, a recipient of a hematopoietic stem cell transplant should receive a 3-dose series 6 12 months after her successful transplant. Hib vaccine is not recommended for adults with HIV infection. Preventive Services / Frequency Ages 19 to 39  Blood pressure check.** / Every 1 to 2 years.  Lipid and cholesterol check.** / Every 5 years beginning at age 20.  Clinical breast exam.** / Every 3 years for women in their 20s and 30s.  BRCA-related cancer risk assessment.** / For women who have family members with a BRCA-related cancer (breast, ovarian, tubal, or peritoneal cancers).  Pap test.** / Every 2 years from ages 21 through 29. Every 3 years starting at age 30 through age 65 or 70 with a history of 3 consecutive normal Pap tests.  HPV screening.** / Every 3 years from ages 30 through ages 65 to 70 with a history of 3 consecutive normal Pap tests.  Hepatitis C blood test.** / For any individual with known risks for hepatitis C.  Skin self-exam. / Monthly.  Influenza vaccine. / Every year.  Tetanus, diphtheria, and acellular pertussis (Tdap, Td) vaccine.** / Consult your caregiver. Pregnant  women should receive 1 dose of Tdap vaccine during each pregnancy. 1 dose of Td every 10 years.  Varicella vaccine.** / Consult your caregiver. Pregnant females who do not have evidence of immunity should receive the first dose after pregnancy.  HPV vaccine. / 3 doses over 6 months, if 26 and younger. The vaccine is not recommended for use in pregnant females. However, pregnancy testing is not needed before receiving a dose.  Measles, mumps, rubella (MMR) vaccine.** / You need at least 1 dose of MMR if you were born in 1957 or later. You may also need a 2nd dose. For females of childbearing age, rubella immunity should be determined. If there is no evidence of immunity, females who are not pregnant should be vaccinated. If there is no evidence of immunity, females who are pregnant should delay immunization until after pregnancy.  Pneumococcal 13-valent conjugate (PCV13) vaccine.** / Consult your caregiver.  Pneumococcal polysaccharide (PPSV23) vaccine.** / 1 to 2 doses if you smoke cigarettes or if you have certain conditions.  Meningococcal vaccine.** / 1 dose if you are age 19 to 21 years and a first-year college student living in a residence hall, or have one of several medical conditions, you need to get vaccinated against meningococcal disease. You may also need additional booster doses.  Hepatitis A vaccine.** / Consult your caregiver.  Hepatitis B vaccine.** / Consult your caregiver.  Haemophilus influenzae type b (Hib) vaccine.** / Consult your caregiver. Ages 40 to 64  Blood pressure check.** / Every 1 to 2 years.  Lipid and cholesterol check.** / Every 5 years beginning at age 20.  Lung cancer screening. / Every year if you are aged 55 80 years and have a 30-pack-year history of smoking and   currently smoke or have quit within the past 15 years. Yearly screening is stopped once you have quit smoking for at least 15 years or develop a health problem that would prevent you from having  lung cancer treatment.  Clinical breast exam.** / Every year after age 40.  BRCA-related cancer risk assessment.** / For women who have family members with a BRCA-related cancer (breast, ovarian, tubal, or peritoneal cancers).  Mammogram.** / Every year beginning at age 40 and continuing for as long as you are in good health. Consult with your caregiver.  Pap test.** / Every 3 years starting at age 30 through age 65 or 70 with a history of 3 consecutive normal Pap tests.  HPV screening.** / Every 3 years from ages 30 through ages 65 to 70 with a history of 3 consecutive normal Pap tests.  Fecal occult blood test (FOBT) of stool. / Every year beginning at age 50 and continuing until age 75. You may not need to do this test if you get a colonoscopy every 10 years.  Flexible sigmoidoscopy or colonoscopy.** / Every 5 years for a flexible sigmoidoscopy or every 10 years for a colonoscopy beginning at age 50 and continuing until age 75.  Hepatitis C blood test.** / For all people born from 1945 through 1965 and any individual with known risks for hepatitis C.  Skin self-exam. / Monthly.  Influenza vaccine. / Every year.  Tetanus, diphtheria, and acellular pertussis (Tdap/Td) vaccine.** / Consult your caregiver. Pregnant women should receive 1 dose of Tdap vaccine during each pregnancy. 1 dose of Td every 10 years.  Varicella vaccine.** / Consult your caregiver. Pregnant females who do not have evidence of immunity should receive the first dose after pregnancy.  Zoster vaccine.** / 1 dose for adults aged 60 years or older.  Measles, mumps, rubella (MMR) vaccine.** / You need at least 1 dose of MMR if you were born in 1957 or later. You may also need a 2nd dose. For females of childbearing age, rubella immunity should be determined. If there is no evidence of immunity, females who are not pregnant should be vaccinated. If there is no evidence of immunity, females who are pregnant should delay  immunization until after pregnancy.  Pneumococcal 13-valent conjugate (PCV13) vaccine.** / Consult your caregiver.  Pneumococcal polysaccharide (PPSV23) vaccine.** / 1 to 2 doses if you smoke cigarettes or if you have certain conditions.  Meningococcal vaccine.** / Consult your caregiver.  Hepatitis A vaccine.** / Consult your caregiver.  Hepatitis B vaccine.** / Consult your caregiver.  Haemophilus influenzae type b (Hib) vaccine.** / Consult your caregiver. Ages 65 and over  Blood pressure check.** / Every 1 to 2 years.  Lipid and cholesterol check.** / Every 5 years beginning at age 20.  Lung cancer screening. / Every year if you are aged 55 80 years and have a 30-pack-year history of smoking and currently smoke or have quit within the past 15 years. Yearly screening is stopped once you have quit smoking for at least 15 years or develop a health problem that would prevent you from having lung cancer treatment.  Clinical breast exam.** / Every year after age 40.  BRCA-related cancer risk assessment.** / For women who have family members with a BRCA-related cancer (breast, ovarian, tubal, or peritoneal cancers).  Mammogram.** / Every year beginning at age 40 and continuing for as long as you are in good health. Consult with your caregiver.  Pap test.** / Every 3 years starting at age   30 through age 65 or 70 with a 3 consecutive normal Pap tests. Testing can be stopped between 65 and 70 with 3 consecutive normal Pap tests and no abnormal Pap or HPV tests in the past 10 years.  HPV screening.** / Every 3 years from ages 30 through ages 65 or 70 with a history of 3 consecutive normal Pap tests. Testing can be stopped between 65 and 70 with 3 consecutive normal Pap tests and no abnormal Pap or HPV tests in the past 10 years.  Fecal occult blood test (FOBT) of stool. / Every year beginning at age 50 and continuing until age 75. You may not need to do this test if you get a colonoscopy  every 10 years.  Flexible sigmoidoscopy or colonoscopy.** / Every 5 years for a flexible sigmoidoscopy or every 10 years for a colonoscopy beginning at age 50 and continuing until age 75.  Hepatitis C blood test.** / For all people born from 1945 through 1965 and any individual with known risks for hepatitis C.  Osteoporosis screening.** / A one-time screening for women ages 65 and over and women at risk for fractures or osteoporosis.  Skin self-exam. / Monthly.  Influenza vaccine. / Every year.  Tetanus, diphtheria, and acellular pertussis (Tdap/Td) vaccine.** / 1 dose of Td every 10 years.  Varicella vaccine.** / Consult your caregiver.  Zoster vaccine.** / 1 dose for adults aged 60 years or older.  Pneumococcal 13-valent conjugate (PCV13) vaccine.** / Consult your caregiver.  Pneumococcal polysaccharide (PPSV23) vaccine.** / 1 dose for all adults aged 65 years and older.  Meningococcal vaccine.** / Consult your caregiver.  Hepatitis A vaccine.** / Consult your caregiver.  Hepatitis B vaccine.** / Consult your caregiver.  Haemophilus influenzae type b (Hib) vaccine.** / Consult your caregiver. ** Family history and personal history of risk and conditions may change your caregiver's recommendations. Document Released: 02/20/2001 Document Revised: 04/21/2012 Document Reviewed: 05/22/2010 [employer] Patient Information 2014 [employer].  

## 2012-12-26 NOTE — Progress Notes (Signed)
  Subjective:     Kristen Wu is a 37 y.o. female and is here for a comprehensive physical exam. The patient reports no problems. IUD is 38 years old and needs to be changed.  Risks discussed.  Has some mild breast tenderness on one side.  History   Social History  . Marital Status: Married    Spouse Name: N/A    Number of Children: N/A  . Years of Education: N/A   Occupational History  . Not on file.   Social History Main Topics  . Smoking status: Never Smoker   . Smokeless tobacco: Not on file  . Alcohol Use: 0.0 oz/week    1-2 drink(s) per week     Comment: occassionally  . Drug Use: No  . Sexual Activity: Yes    Partners: Male    Birth Control/ Protection: IUD   Other Topics Concern  . Not on file   Social History Narrative  . No narrative on file   Health Maintenance  Topic Date Due  . Tetanus/tdap  01/12/1993  . Influenza Vaccine  08/08/2012  . Pap Smear  05/22/2014    The following portions of the patient's history were reviewed and updated as appropriate: allergies, current medications, past family history, past medical history, past social history, past surgical history and problem list.  Review of Systems A comprehensive review of systems was negative.   Objective:    BP 117/79  Pulse 51  Ht 5\' 6"  (1.676 m)  Wt 148 lb (67.132 kg)  BMI 23.90 kg/m2 General appearance: alert, cooperative and appears stated age Head: Normocephalic, without obvious abnormality, atraumatic Neck: no adenopathy, supple, symmetrical, trachea midline and thyroid not enlarged, symmetric, no tenderness/mass/nodules Lungs: clear to auscultation bilaterally Breasts: normal appearance, no masses or tenderness Heart: regular rate and rhythm, S1, S2 normal, no murmur, click, rub or gallop Abdomen: soft, non-tender; bowel sounds normal; no masses,  no organomegaly Pelvic: cervix normal in appearance, external genitalia normal, no adnexal masses or tenderness, no cervical motion  tenderness, uterus normal size, shape, and consistency and vagina normal without discharge Extremities: extremities normal, atraumatic, no cyanosis or edema Pulses: 2+ and symmetric Skin: Skin color, texture, turgor normal. No rashes or lesions Lymph nodes: Cervical, supraclavicular, and axillary nodes normal. Neurologic: Grossly normal    Assessment:    Healthy female exam. Needs IUD changed.     Plan:    Pap smear Annual labs Return for IUD change Declines flu See After Visit Summary for Counseling Recommendations

## 2012-12-30 ENCOUNTER — Other Ambulatory Visit: Payer: Self-pay | Admitting: Family Medicine

## 2013-04-04 IMAGING — US TRANSABDOMINAL ULTRASOUND OF PELVIS
1 series · 17 of 25 positions shown · non-contrast
Comparison: none

REASON FOR EXAM: Ct shows renal cyst   ovarian cyst
COMMENTS:

PROCEDURE:     OPARA - OPARA PELVIS NON-OB W/TRANSVAGINAL  - May 14, 2011  [DATE]
RESULT:
Real-time sonographic imaging of the pelvis was obtained.

[Series 1: transabdominal ultrasound of pelvis · 17 of 92 slices shown]
[im 1/92]
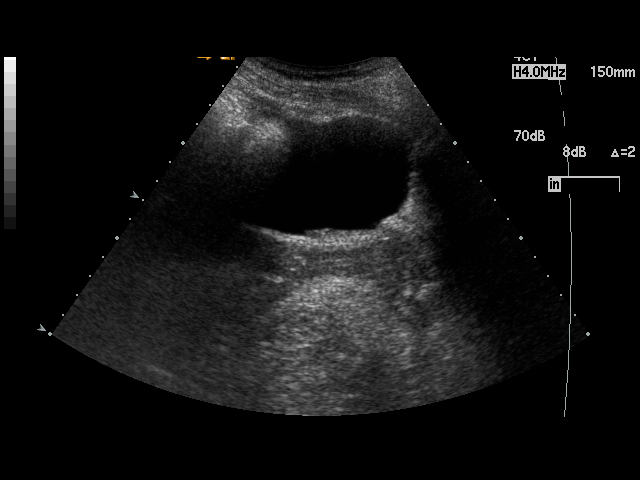
[im 8/92]
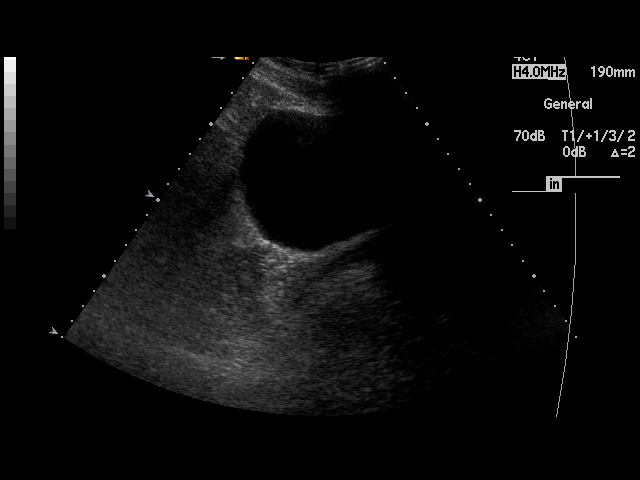
[im 12/92]
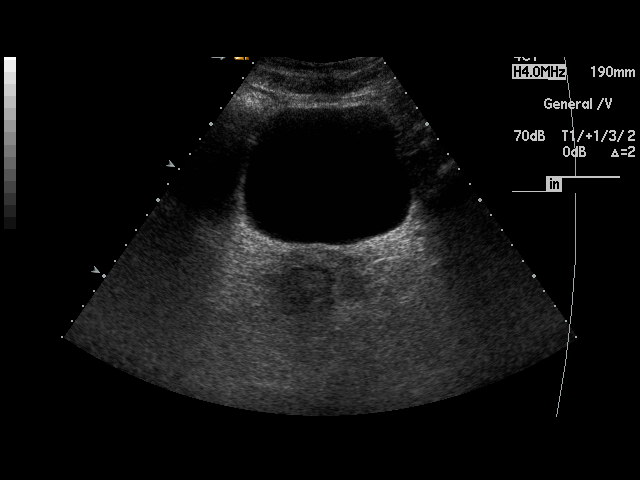
[im 19/92]
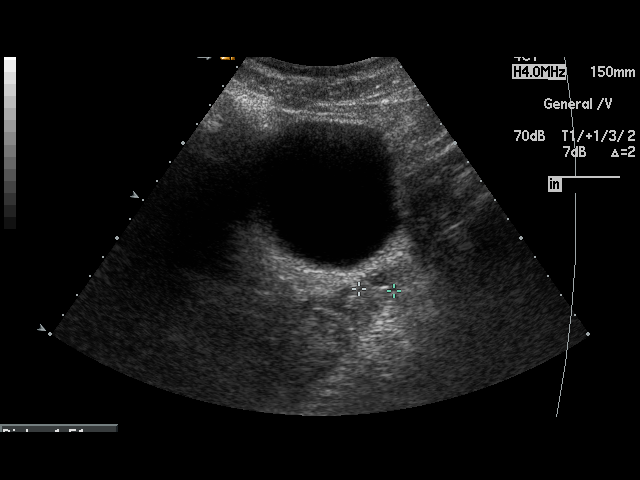
[im 23/92]
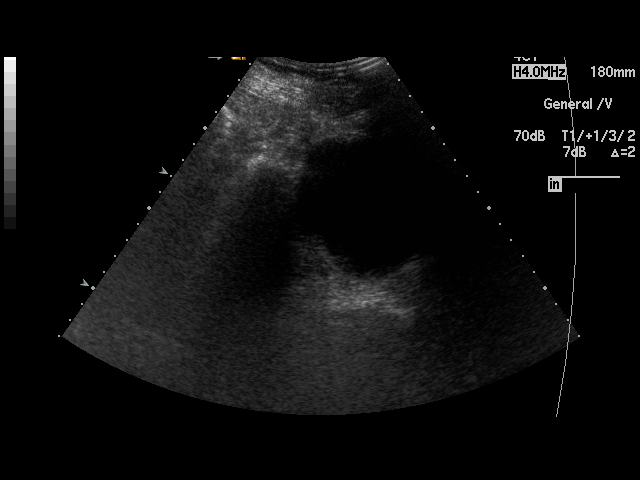
[im 31/92]
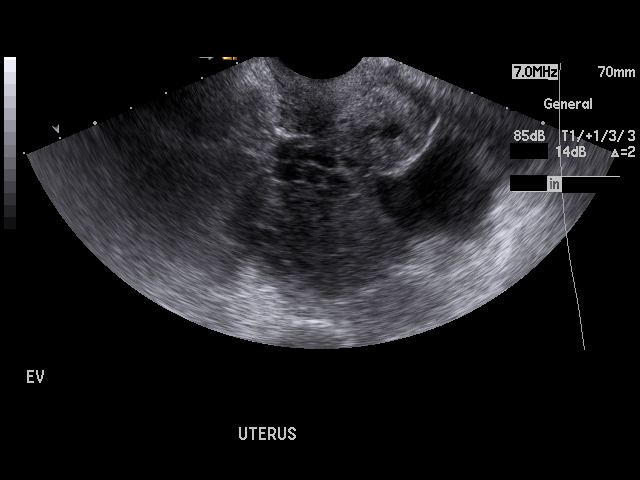
[im 35/92]
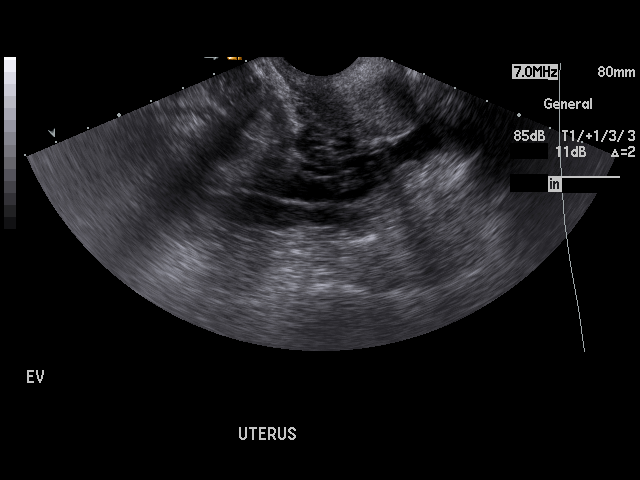
[im 42/92]
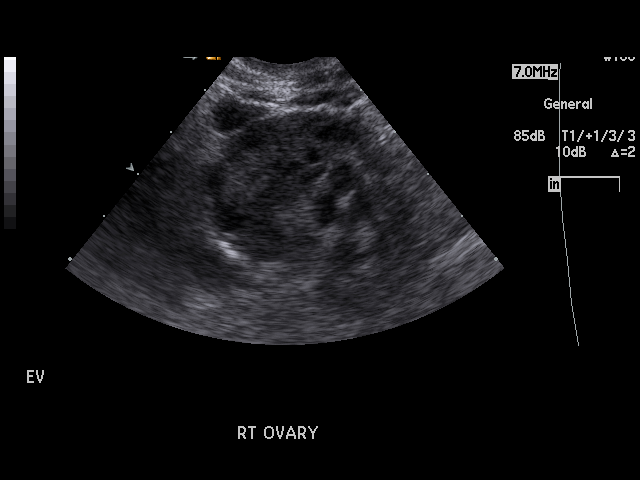
[im 46/92]
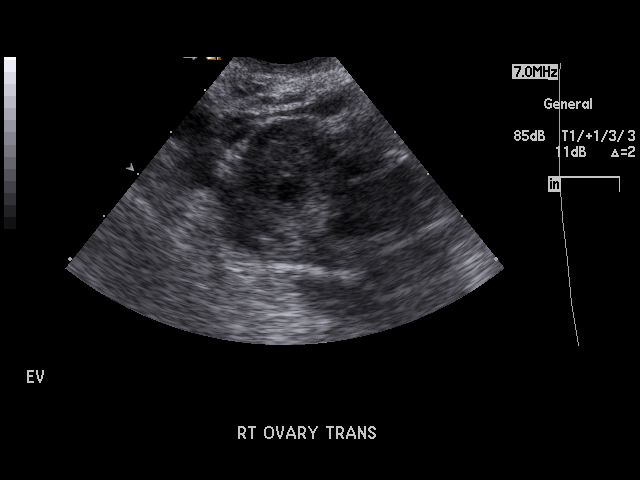
[im 50/92]
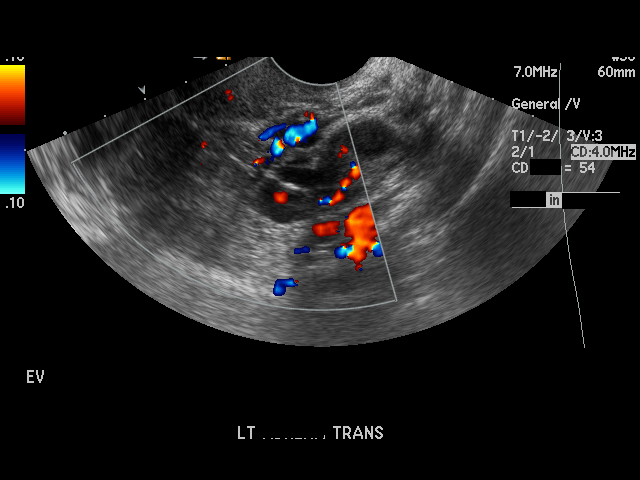
[im 57/92]
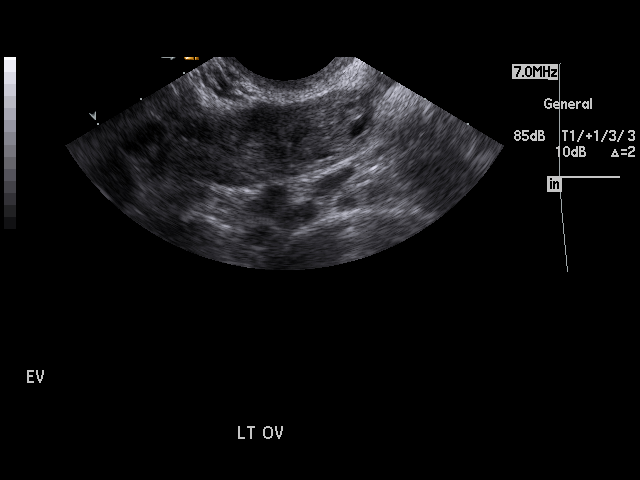
[im 61/92]
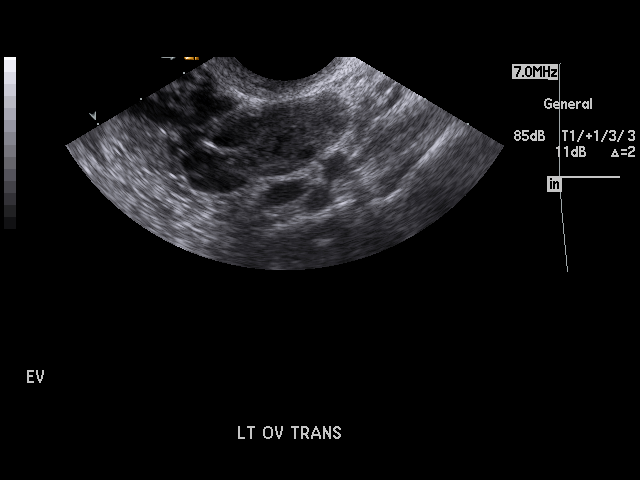
[im 69/92]
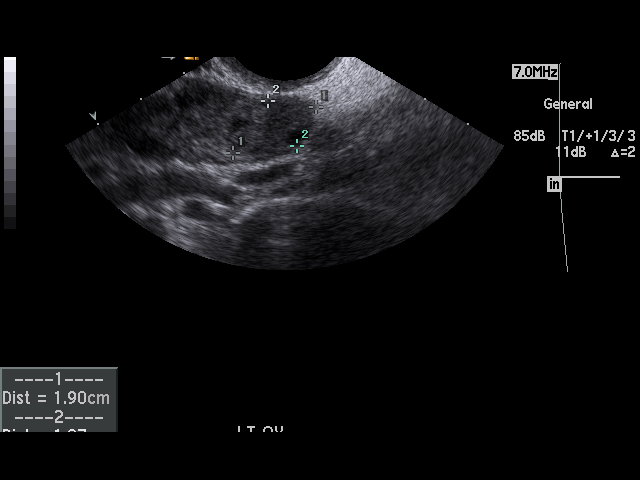
[im 73/92]
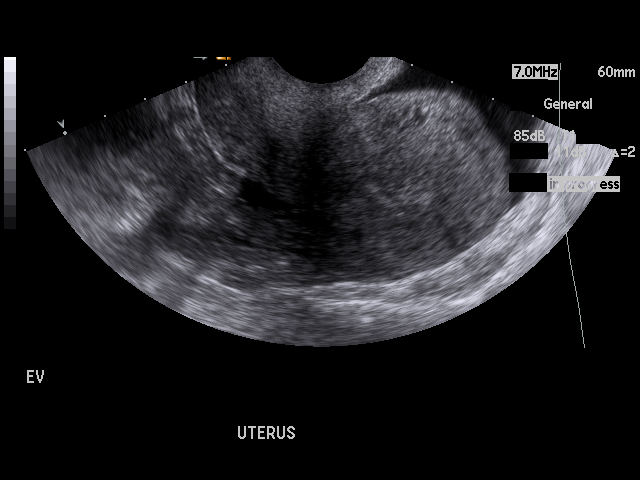
[im 80/92]
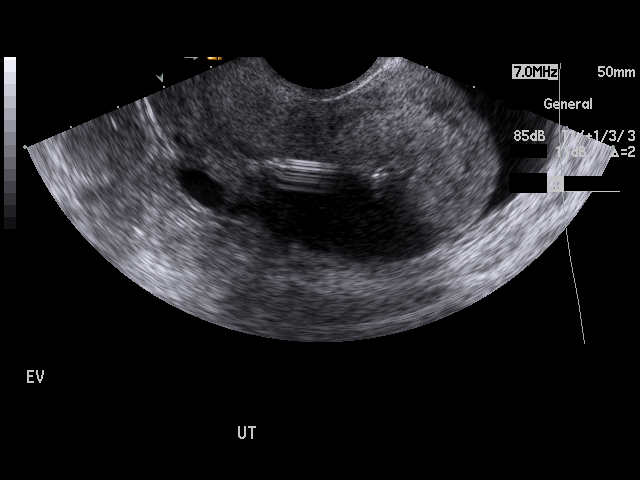
[im 84/92]
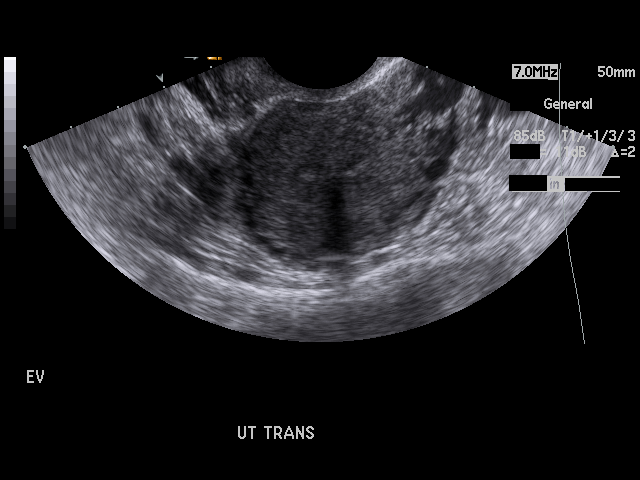
[im 92/92]
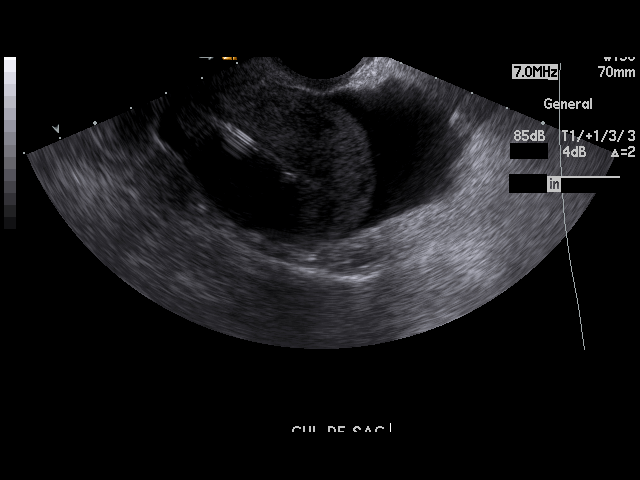

[17 of 25 positions shown; findings below may reference images not displayed]

FINDINGS: The uterus is retroverted measuring 7.07 x 4.36 x 3.93 cm. An IUD
is appreciated. The endometrial canal measures 3.5 mm in thickness. The
uterus otherwise demonstrates homogeneous echotexture. A moderate amount of
fluid is identified within the cul-de-sac. Multiple prominent vessels are
appreciated within the pelvis demonstrating color filling. The right ovary
measures 2.97 x 1.64 x 1.75 cm and the left 3.63 x 1.45 x 1.82 cm. A
partially collapsed cyst versus possibly a dominant follicle is identified
within the left ovary measuring 1.64 x 1.19 x 1.9 cm. There is no evidence
of hydronephrosis.
IMPRESSION: 1.  Moderate amount of fluid within the cul-de-sac.
2.  Prominent vessels within the pelvis. This may represent a component of
pelvic congestion syndrome.
3.  Collapsed cyst versus dominant follicle, left ovary.

## 2013-04-04 IMAGING — US US RENAL KIDNEY
1 series · 17 of 25 positions shown · non-contrast
Comparison: none

REASON FOR EXAM: Ct shows renal cyst   ovarian cyst
COMMENTS:

[Series 1: us renal kidney · 17 of 44 slices shown]
[im 1/44]
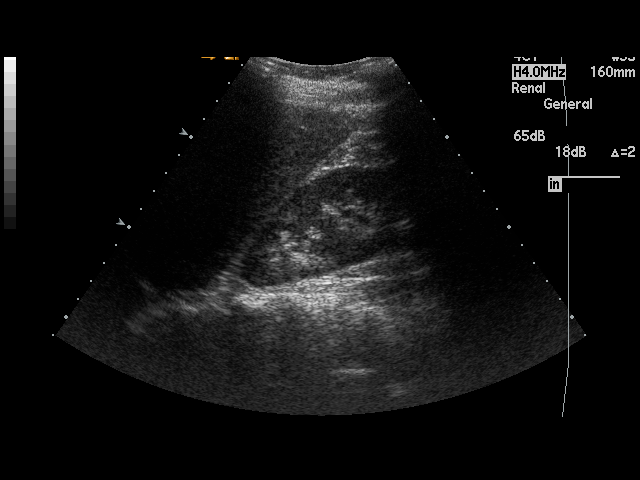
[im 4/44]
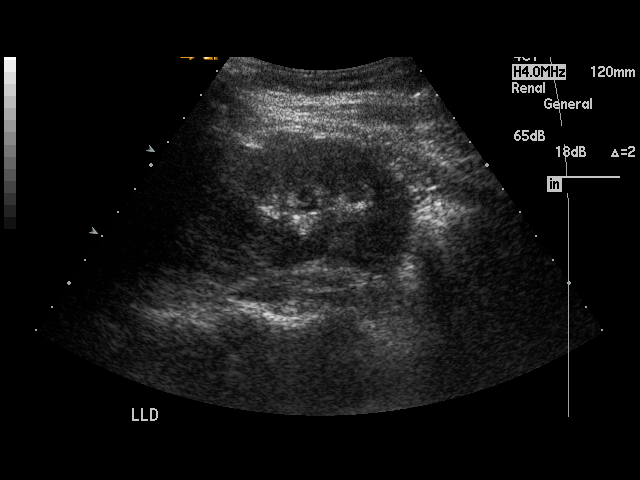
[im 6/44]
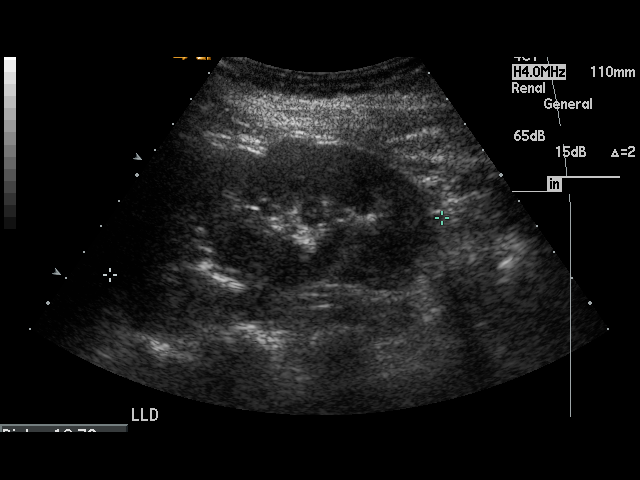
[im 9/44]
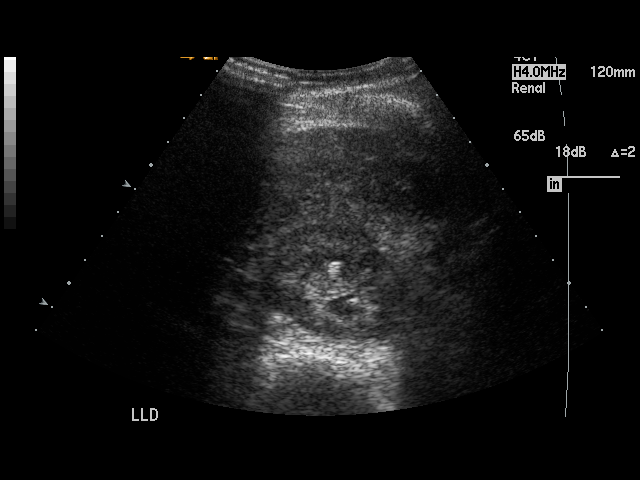
[im 11/44]
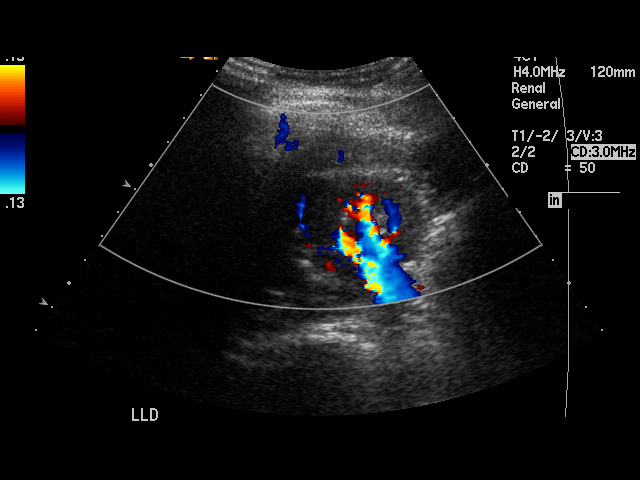
[im 15/44]
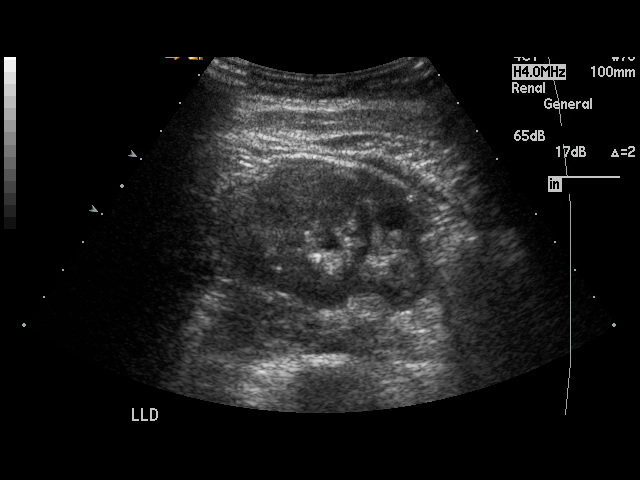
[im 17/44]
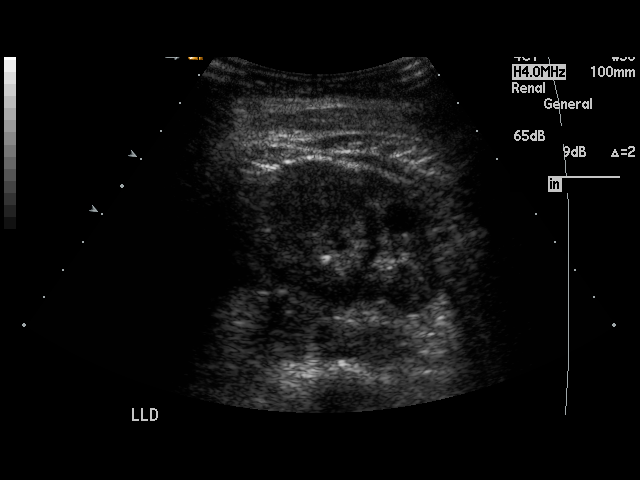
[im 20/44]
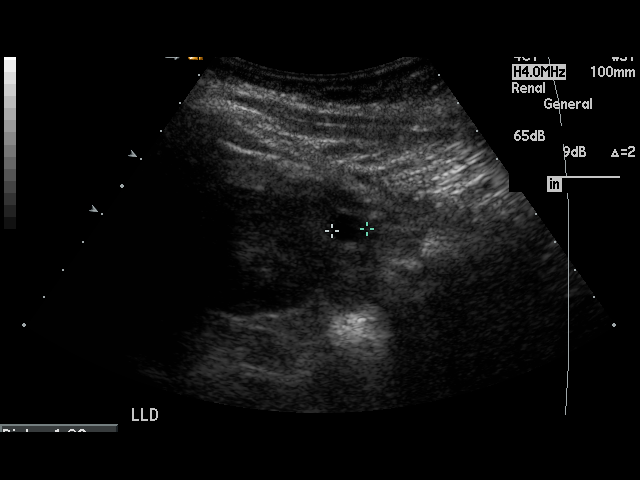
[im 22/44]
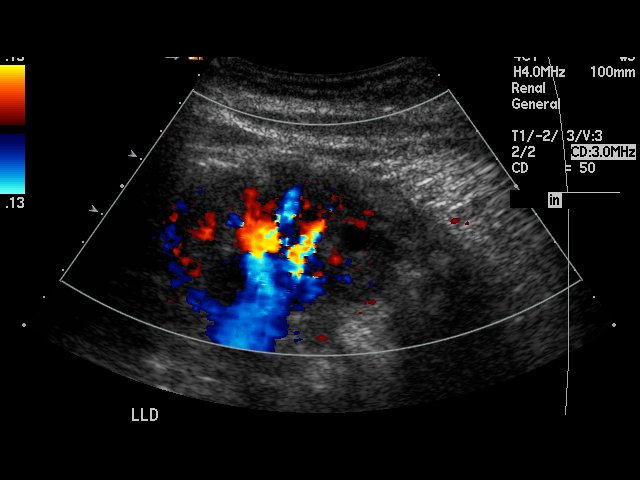
[im 24/44]
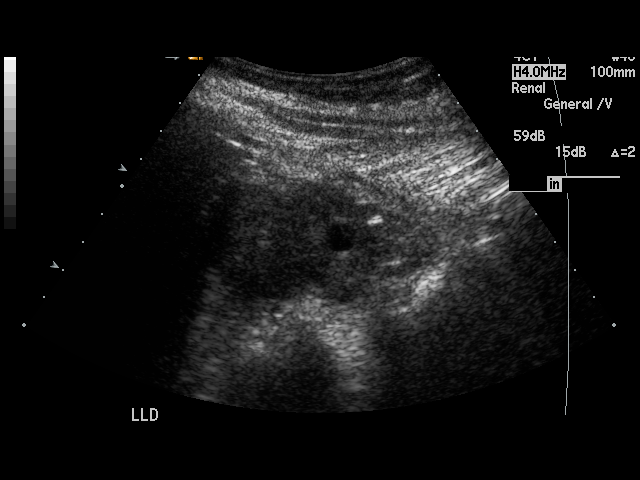
[im 27/44]
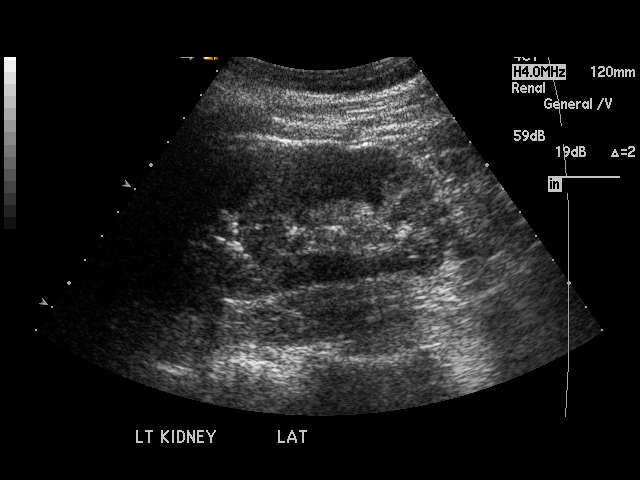
[im 29/44]
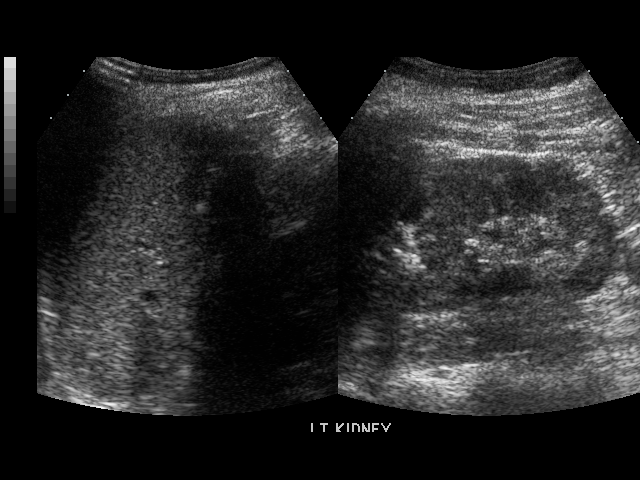
[im 33/44]
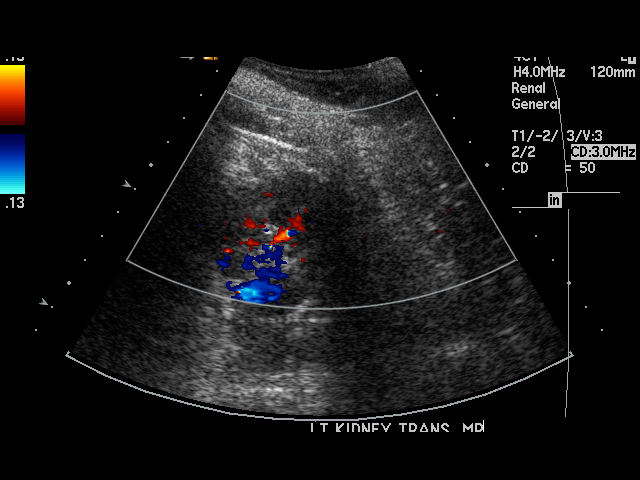
[im 35/44]
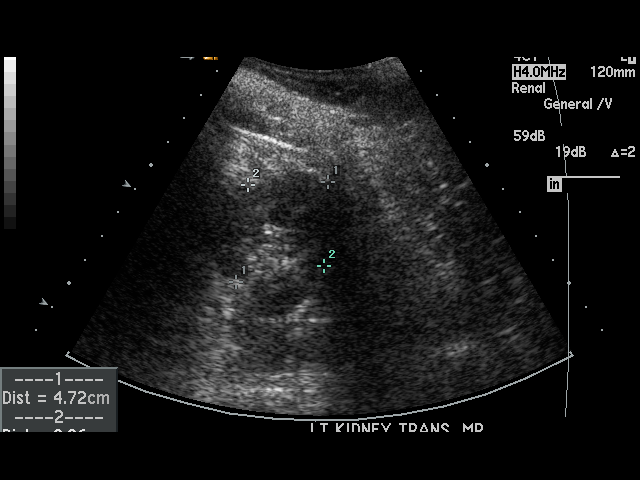
[im 38/44]
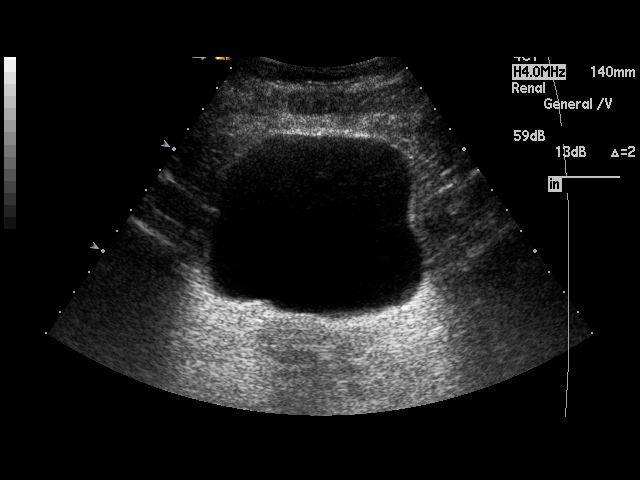
[im 40/44]
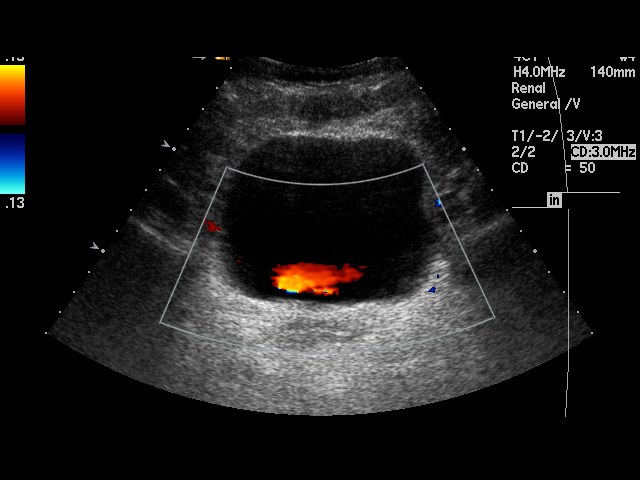
[im 44/44]
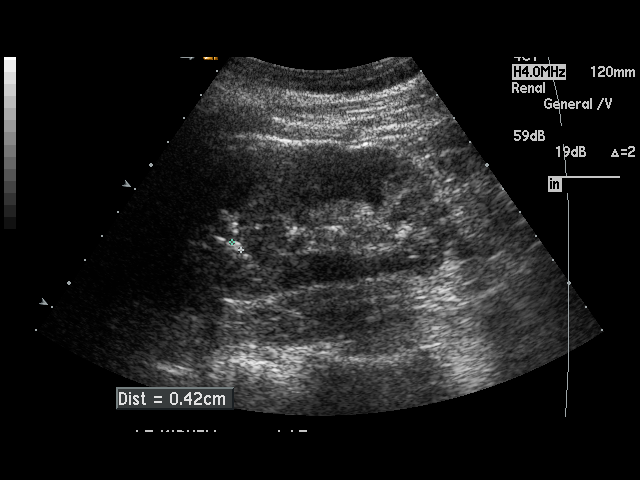

[17 of 25 positions shown; findings below may reference images not displayed]

PROCEDURE:     KIRAKOYA - KIRAKOYA KIDNEYS  - May 14, 2011  [DATE]

RESULT:

The right kidney measures 10.79 x 4.23 x 5.64 cm and the left 11.27 x 4.72 x
3.86 cm. There is appropriate corticomedullary differentiation without
evidence of hydronephrosis or solid masses. Small punctate echogenic foci
are identified within the right and left kidneys likely representing small
renal calculi consistent with findings on prior CT. A cyst is identified
within the lower pole of the right kidney measuring 0.95 x 0.91 x 1.03 cm.
Bilateral ureteral jets are identified within the urinary bladder which is
otherwise unremarkable.
IMPRESSION: 1.  Findings consistent with small, nonobstructing bilateral renal calculi.
2.  Small cyst involving the right kidney.

## 2013-05-20 ENCOUNTER — Encounter: Payer: Self-pay | Admitting: Family Medicine

## 2013-05-20 ENCOUNTER — Ambulatory Visit (INDEPENDENT_AMBULATORY_CARE_PROVIDER_SITE_OTHER): Payer: BC Managed Care – PPO | Admitting: Family Medicine

## 2013-05-20 VITALS — BP 108/64 | HR 65 | Temp 98.5°F | Resp 16 | Ht 65.5 in | Wt 145.8 lb

## 2013-05-20 DIAGNOSIS — F411 Generalized anxiety disorder: Secondary | ICD-10-CM

## 2013-05-20 DIAGNOSIS — M25562 Pain in left knee: Secondary | ICD-10-CM

## 2013-05-20 DIAGNOSIS — G5620 Lesion of ulnar nerve, unspecified upper limb: Secondary | ICD-10-CM

## 2013-05-20 DIAGNOSIS — M25551 Pain in right hip: Secondary | ICD-10-CM

## 2013-05-20 DIAGNOSIS — F909 Attention-deficit hyperactivity disorder, unspecified type: Secondary | ICD-10-CM

## 2013-05-20 DIAGNOSIS — M25559 Pain in unspecified hip: Secondary | ICD-10-CM

## 2013-05-20 DIAGNOSIS — F419 Anxiety disorder, unspecified: Secondary | ICD-10-CM

## 2013-05-20 DIAGNOSIS — M25552 Pain in left hip: Secondary | ICD-10-CM

## 2013-05-20 DIAGNOSIS — G8929 Other chronic pain: Secondary | ICD-10-CM

## 2013-05-20 DIAGNOSIS — R5381 Other malaise: Secondary | ICD-10-CM

## 2013-05-20 DIAGNOSIS — R5383 Other fatigue: Secondary | ICD-10-CM

## 2013-05-20 DIAGNOSIS — M25561 Pain in right knee: Secondary | ICD-10-CM

## 2013-05-20 DIAGNOSIS — G4700 Insomnia, unspecified: Secondary | ICD-10-CM

## 2013-05-20 LAB — POCT URINALYSIS DIPSTICK
BILIRUBIN UA: NEGATIVE
Blood, UA: NEGATIVE
GLUCOSE UA: NEGATIVE
KETONES UA: NEGATIVE
NITRITE UA: NEGATIVE
Protein, UA: NEGATIVE
Spec Grav, UA: 1.02
Urobilinogen, UA: 0.2
pH, UA: 7

## 2013-05-20 MED ORDER — AMPHETAMINE-DEXTROAMPHETAMINE 10 MG PO TABS: 10.0000 mg | ORAL_TABLET | Freq: Two times a day (BID) | ORAL | Status: DC

## 2013-05-20 MED ORDER — MELOXICAM 15 MG PO TABS: 15.0000 mg | ORAL_TABLET | Freq: Every day | ORAL | Status: DC

## 2013-05-20 MED ORDER — TIZANIDINE HCL 4 MG PO CAPS
4.0000 mg | ORAL_CAPSULE | Freq: Every evening | ORAL | Status: DC | PRN
Start: 2013-05-20 — End: 2014-01-03

## 2013-05-20 MED ORDER — SERTRALINE HCL 50 MG PO TABS: 50.0000 mg | ORAL_TABLET | Freq: Every day | ORAL | Status: DC

## 2013-05-20 NOTE — Progress Notes (Signed)
**Note Kristen-Identified via Obfuscation** This chart was scribed for Wardell Honour, MD by Allena Earing, ED Scribe. This patient was seen in room 21 and the patient's care was started at 11:32 AM .  Subjective:    Patient ID: Kristen Wu, female    DOB: [birth year], 38 y.o.   MRN: [id number]  HPI  HPI Comments: Kristen Wu is a 38 y.o. female with a h/o ADHD, anxiety who presents to the [facility] as she has not been here since 2013 and that she has not had her ADD medication in more than a year as. She also reports chronic fatigue, she believes that this is due to her not being in shape and "eating nothing but OREOS". She states that her fatigue is the same as it has been. She goes to sleep by 10, but reports that she "drags out of bed" 50% of the time in the morning. She states that she wakes up 2-3 times a night and stays awake for 15 minutes per episode. Pt does not take Ambien often as she does not like its effects.  Pt had been reporting joint pain that is worse in the mornings, "it takes time for her to loosen up" She reports that all of her joints hurts and that there is associated swelling. She had been to a specialist for this pain and that her visit did not provide any answers and that he did not think there was anything wrong with her.   She also reports that she has a problem with numbness and tingling in her 4th and pinky finger. This has been present for more than a year. She states that she does several repetitives tasks that she does with her hands and that she has been dropping things more. She states that she has been "eating Advil".  She has been doing well emotionally and reports that she is more "laid back now".   Pt wants non-extended release medication for her ADD as she states the side effects are not as bad.  She reports that the Zoloft does not make her fatigued.   Past Medical History  Diagnosis Date  . Mitral valve prolapse   . Abnormal Pap smear   . Asthma   . Anxiety   .  Nephrolithiasis   . Allergy      Current Outpatient Prescriptions on File Prior to Visit  Medication Sig Dispense Refill  . levocetirizine (XYZAL) 5 MG tablet Take 5 mg by mouth every evening.    Marland Kitchen levonorgestrel (MIRENA) 20 MCG/24HR IUD 1 each by Intrauterine route once.    . montelukast (SINGULAIR) 10 MG tablet Take 1 tablet (10 mg total) by mouth at bedtime. 30 tablet 11  . zolpidem (AMBIEN) 10 MG tablet TAKE 1 TABLET BY MOUTH AT BEDTIME 30 tablet 5   No current facility-administered medications on file prior to visit.   Allergies  Allergen Reactions  . Sulfa Drugs Cross Reactors Rash    Review of Systems  Constitutional: Negative for activity change and appetite change.  HENT: Negative for postnasal drip, rhinorrhea, sinus pressure, sneezing and sore throat.   Respiratory: Negative for cough and shortness of breath.   Gastrointestinal: Negative for nausea, abdominal pain, diarrhea, constipation and blood in stool.  Musculoskeletal: Positive for myalgias and arthralgias. Negative for back pain, neck pain and neck stiffness.  Neurological: Positive for weakness and numbness. Negative for dizziness, tremors, seizures, syncope, facial asymmetry, speech difficulty and headaches.  Psychiatric/Behavioral: Positive for sleep disturbance and  decreased concentration (needs more ADD medication). Negative for suicidal ideas, self-injury, dysphoric mood and agitation. The patient is nervous/anxious.        Objective:   Physical Exam  Constitutional: She is oriented to person, place, and time. She appears well-developed and well-nourished. No distress.  HENT:  Head: Normocephalic and atraumatic.  Right Ear: External ear normal.  Left Ear: External ear normal.  Nose: Nose normal.  Mouth/Throat: Oropharynx is clear and moist.  Eyes: Conjunctivae and EOM are normal. Pupils are equal, round, and reactive to light. No scleral icterus.  Neck: Normal range of motion. Neck supple. Carotid bruit  is not present. No thyromegaly present.  Cardiovascular: Normal rate, regular rhythm, normal heart sounds and intact distal pulses.  Exam reveals no gallop and no friction rub.   No murmur heard. Pulmonary/Chest: Effort normal and breath sounds normal. No stridor. She has no wheezes. She has no rales. She exhibits no tenderness.  Abdominal: Soft. Bowel sounds are normal. She exhibits no distension and no mass. There is no tenderness. There is no rebound and no guarding.  Musculoskeletal: Normal range of motion. She exhibits no edema.  Lymphadenopathy:    She has no cervical adenopathy.  Neurological: She is alert and oriented to person, place, and time. No cranial nerve deficit. She exhibits normal muscle tone. Coordination normal.  Skin: Skin is warm and dry. No rash noted. She is not diaphoretic. No erythema. No pallor.  Psychiatric: She has a normal mood and affect. Her behavior is normal.  Nursing note and vitals reviewed.   Filed Vitals:   05/20/13 1126  BP: 108/64  Pulse: 65  Temp: 98.5 F (36.9 C)  Resp: 16       Assessment & Plan:  ADHD (attention deficit hyperactivity disorder)  Anxiety - Plan: TSH  Fatigue - Plan: POCT urinalysis dipstick, Sedimentation Rate, CBC, COMPLETE METABOLIC PANEL WITH GFR, TSH  Insomnia - Plan: Sedimentation Rate, TSH  Chronic arthralgias of knees and hips - Plan: Sedimentation Rate  Ulnar neuropathy - Plan: Ambulatory referral to Orthopedic Surgery, Sedimentation Rate   1. ADHD:  Uncontrolled; rx for Adderall 43m bid for three months provided; call in three months for refill; follow-up in six months or sooner if desires adjustment to medications. 2.  Anxiety: controlled with Zoloft; refill provided; obtain TSH. 3. Fatigue: New.  Obtain labs.  Associated with arthralgias.  Pt does not feel like side effect to Zoloft but this is a possibility. 4.  Insomnia: uncontrolled; recommend trial of Melatonin. 5.  Chronic arthralgias: persistent;  obtain ESR; s/p rheumatology consult in past two years. Consider second opinion.  Rx for Mobic provided. 6.  Ulnar neuropathy:  New. Onset one year ago; refer to ortho.  Rx for Mobic provided.   Meds ordered this encounter  Medications  . fluticasone (FLONASE) 50 MCG/ACT nasal spray    Sig: Place into both nostrils daily.  .Marland KitchenPRESCRIPTION MEDICATION    Sig: Ventolin HFA inhaler 1-2 puffs every 4-6 hours prn  . DISCONTD: amphetamine-dextroamphetamine (ADDERALL) 10 MG tablet    Sig: Take 1 tablet (10 mg total) by mouth 2 (two) times daily with a meal.    Dispense:  60 tablet    Refill:  0  . DISCONTD: amphetamine-dextroamphetamine (ADDERALL) 10 MG tablet    Sig: Take 1 tablet (10 mg total) by mouth 2 (two) times daily with a meal.    Dispense:  60 tablet    Refill:  0    DO NOT FILL UNTIL  06-20-13.  Marland Kitchen amphetamine-dextroamphetamine (ADDERALL) 10 MG tablet    Sig: Take 1 tablet (10 mg total) by mouth 2 (two) times daily with a meal.    Dispense:  60 tablet    Refill:  0    DO NOT FILL UNTIL 07-20-13.  . sertraline (ZOLOFT) 50 MG tablet    Sig: Take 1 tablet (50 mg total) by mouth daily.    Dispense:  30 tablet    Refill:  5  . tiZANidine (ZANAFLEX) 4 MG capsule    Sig: Take 1 capsule (4 mg total) by mouth at bedtime as needed.    Dispense:  30 capsule    Refill:  5  . DISCONTD: meloxicam (MOBIC) 15 MG tablet    Sig: Take 1 tablet (15 mg total) by mouth daily.    Dispense:  30 tablet    Refill:  3     12:00 PM-Discussed treatment plan which includes labs, going back to a specialist about her joint pain, new ADD medication, her emotionally well being with pt at bedside and pt agreed to plan.    I personally performed the services described in this documentation, which was scribed in my presence. The recorded information has been reviewed and is accurate.

## 2013-05-21 LAB — COMPLETE METABOLIC PANEL WITH GFR
ALT: 14 U/L (ref 0–35)
AST: 19 U/L (ref 0–37)
Albumin: 4.4 g/dL (ref 3.5–5.2)
Alkaline Phosphatase: 48 U/L (ref 39–117)
BUN: 11 mg/dL (ref 6–23)
CALCIUM: 9.9 mg/dL (ref 8.4–10.5)
CHLORIDE: 102 meq/L (ref 96–112)
CO2: 28 mEq/L (ref 19–32)
CREATININE: 0.61 mg/dL (ref 0.50–1.10)
GFR, Est African American: >89 mL/min
GFR, Est Non African American: >89 mL/min
Glucose, Bld: 75 mg/dL (ref 70–99)
Potassium: 3.8 mEq/L (ref 3.5–5.3)
Sodium: 137 mEq/L (ref 135–145)
Total Bilirubin: 0.6 mg/dL (ref 0.2–1.2)
Total Protein: 6.9 g/dL (ref 6.0–8.3)

## 2013-05-21 LAB — CBC
HEMATOCRIT: 38.8 % (ref 36.0–46.0)
HEMOGLOBIN: 13.1 g/dL (ref 12.0–15.0)
MCH: 31 pg (ref 26.0–34.0)
MCHC: 33.8 g/dL (ref 30.0–36.0)
MCV: 91.7 fL (ref 78.0–100.0)
Platelets: 277 10*3/uL (ref 150–400)
RBC: 4.23 MIL/uL (ref 3.87–5.11)
RDW: 12.5 % (ref 11.5–15.5)
WBC: 6.3 10*3/uL (ref 4.0–10.5)

## 2013-05-21 LAB — SEDIMENTATION RATE: Sed Rate: 4 mm/hr (ref 0–22)

## 2013-05-21 LAB — TSH: TSH: 1.091 u[IU]/mL (ref 0.350–4.500)

## 2013-06-09 ENCOUNTER — Telehealth: Payer: Self-pay

## 2013-06-09 MED ORDER — ALPRAZOLAM 0.5 MG PO TBDP: 0.5000 mg | ORAL_TABLET | Freq: Two times a day (BID) | ORAL | Status: DC | PRN

## 2013-06-09 NOTE — Telephone Encounter (Signed)
Please call in Alprazolam as approved.

## 2013-06-09 NOTE — Telephone Encounter (Signed)
Pt faxed a request to Dr Katrinka Blazing to ask her for a Rx for medication that will help her stay calm during her nerve conduction study and another procedure that involves needles that is being performed on the 8th.

## 2013-06-10 NOTE — Telephone Encounter (Signed)
Rx called to [facility] [place].  Patient notified via vm.

## 2013-08-17 ENCOUNTER — Ambulatory Visit: Payer: BC Managed Care – PPO | Admitting: Family Medicine

## 2013-08-31 ENCOUNTER — Ambulatory Visit: Payer: Self-pay | Admitting: Specialist

## 2013-08-31 LAB — CBC WITH DIFFERENTIAL/PLATELET
BASOS ABS: 0.1 10*3/uL (ref 0.0–0.1)
Basophil %: 0.8 %
Eosinophil #: 0.2 10*3/uL (ref 0.0–0.7)
Eosinophil %: 2.9 %
HCT: 38.2 % (ref 35.0–47.0)
HGB: 12.9 g/dL (ref 12.0–16.0)
LYMPHS ABS: 1.5 10*3/uL (ref 1.0–3.6)
LYMPHS PCT: 23.5 %
MCH: 31.5 pg (ref 26.0–34.0)
MCHC: 33.6 g/dL (ref 32.0–36.0)
MCV: 94 fL (ref 80–100)
MONO ABS: 0.6 x10 3/mm (ref 0.2–0.9)
Monocyte %: 9.2 %
Neutrophil #: 4.2 10*3/uL (ref 1.4–6.5)
Neutrophil %: 63.6 %
Platelet: 231 10*3/uL (ref 150–440)
RBC: 4.08 10*6/uL (ref 3.80–5.20)
RDW: 12.3 % (ref 11.5–14.5)
WBC: 6.6 10*3/uL (ref 3.6–11.0)

## 2013-09-07 ENCOUNTER — Encounter: Payer: BC Managed Care – PPO | Admitting: Family Medicine

## 2013-09-09 ENCOUNTER — Ambulatory Visit: Payer: Self-pay | Admitting: Specialist

## 2013-09-22 NOTE — Progress Notes (Signed)
This encounter was created in error - please disregard.

## 2013-10-05 ENCOUNTER — Other Ambulatory Visit: Payer: Self-pay | Admitting: Family Medicine

## 2013-11-09 ENCOUNTER — Encounter: Payer: Self-pay | Admitting: Family Medicine

## 2013-12-02 ENCOUNTER — Other Ambulatory Visit: Payer: Self-pay | Admitting: Family Medicine

## 2014-01-03 ENCOUNTER — Other Ambulatory Visit: Payer: Self-pay | Admitting: Family Medicine

## 2014-05-01 NOTE — Op Note (Signed)
PATIENT NAME:  Kristen Wu, Kristen Wu MR#:  [id number] DATE OF BIRTH:  [birth year]  DATE OF PROCEDURE:  09/09/2013  PREOPERATIVE DIAGNOSIS:  Right cubital tunnel syndrome.   POSTOPERATIVE DIAGNOSIS:  Right cubital tunnel syndrome.   OPERATION:  Subcutaneous anterior transposition right ulnar nerve at the elbow.   SURGEON:  Valinda HoarHoward E. Kadie Balestrieri, MD.   ANESTHESIA: General LMA.   COMPLICATIONS:  None.   DRAINS:  None.   DESCRIPTION OF PROCEDURE:  The patient was brought to the operating room where she underwent satisfactory general LMA anesthesia in the supine position. The right arm was prepped and draped in sterile fashion. Esmarch was applied. The tourniquet inflated to 250 mmHg. Tourniquet time was 36 minutes. A gently curved medial incision was made and dissection carried out bluntly to subcutaneous tissue. The soft tissue was spread carefully just above the elbow and the ulnar nerve was visualized between muscle bellies. This was gently freed up from adhesions in the sheath and followed proximally. The sheath was then followed distally and carefully opened all the way past the cubital tunnel and down between and through the flexor muscles. The nerve was then carefully freed using loupe magnification.  The nerve was freed up from adhesions from proximal to distal and was then easily transferred in front of the medial epicondyle.   After thorough irrigation, the subcutaneous tissues were sutured to the medial condyle to prevent the nerve from subluxing back into its original bed. Range of motion was good and the nerve was freely mobile proximally and distally. The subcutaneous tissue was then closed with 3-0 Vicryl, and the skin was closed with staples. Marcaine 0.5% was placed in the wound. A dry, sterile compression hand and arm dressing with a sugar tong splint was applied. The tourniquet was deflated with good return of blood flow to the hand. The patient was awakened and taken to recovery in good  condition.    ____________________________ Valinda HoarHoward E. Cionna Collantes, MD hem:lt D: 09/09/2013 08:53:31 ET T: 09/09/2013 14:31:08 ET JOB#: [id number]  cc: Valinda HoarHoward E. Tasheema Perrone, MD, <Dictator> Valinda HoarHOWARD E Lavone Barrientes MD ELECTRONICALLY SIGNED 09/12/2013 11:56

## 2014-07-02 ENCOUNTER — Encounter: Payer: Self-pay | Admitting: Family Medicine

## 2014-07-02 ENCOUNTER — Ambulatory Visit (INDEPENDENT_AMBULATORY_CARE_PROVIDER_SITE_OTHER): Payer: BLUE CROSS/BLUE SHIELD | Admitting: Family Medicine

## 2014-07-02 VITALS — BP 116/71 | HR 69 | Temp 99.5°F | Resp 16 | Ht 65.5 in | Wt 144.0 lb

## 2014-07-02 DIAGNOSIS — S39012A Strain of muscle, fascia and tendon of lower back, initial encounter: Secondary | ICD-10-CM

## 2014-07-02 DIAGNOSIS — I341 Nonrheumatic mitral (valve) prolapse: Secondary | ICD-10-CM | POA: Diagnosis not present

## 2014-07-02 DIAGNOSIS — J4520 Mild intermittent asthma, uncomplicated: Secondary | ICD-10-CM | POA: Diagnosis not present

## 2014-07-02 DIAGNOSIS — Z1231 Encounter for screening mammogram for malignant neoplasm of breast: Secondary | ICD-10-CM | POA: Diagnosis not present

## 2014-07-02 DIAGNOSIS — N200 Calculus of kidney: Secondary | ICD-10-CM

## 2014-07-02 DIAGNOSIS — J301 Allergic rhinitis due to pollen: Secondary | ICD-10-CM

## 2014-07-02 DIAGNOSIS — F902 Attention-deficit hyperactivity disorder, combined type: Secondary | ICD-10-CM

## 2014-07-02 DIAGNOSIS — F419 Anxiety disorder, unspecified: Secondary | ICD-10-CM

## 2014-07-02 DIAGNOSIS — G44209 Tension-type headache, unspecified, not intractable: Secondary | ICD-10-CM

## 2014-07-02 MED ORDER — AMPHETAMINE-DEXTROAMPHETAMINE 10 MG PO TABS: 10.0000 mg | ORAL_TABLET | Freq: Two times a day (BID) | ORAL | Status: DC

## 2014-07-02 MED ORDER — LEVOCETIRIZINE DIHYDROCHLORIDE 5 MG PO TABS: 5.0000 mg | ORAL_TABLET | Freq: Every evening | ORAL | Status: DC

## 2014-07-02 MED ORDER — TIZANIDINE HCL 4 MG PO TABS: 4.0000 mg | ORAL_TABLET | Freq: Every day | ORAL | Status: DC

## 2014-07-02 MED ORDER — FLUTICASONE PROPIONATE 50 MCG/ACT NA SUSP: 2.0000 | Freq: Every day | NASAL | Status: DC

## 2014-07-02 MED ORDER — MELOXICAM 15 MG PO TABS: 15.0000 mg | ORAL_TABLET | Freq: Every day | ORAL | Status: DC

## 2014-07-02 NOTE — Patient Instructions (Signed)
Low Back Sprain with Rehab  A sprain is an injury in which a ligament is torn. The ligaments of the lower back are vulnerable to sprains. However, they are strong and require great force to be injured. These ligaments are important for stabilizing the spinal column. Sprains are classified into three categories. Grade 1 sprains cause pain, but the tendon is not lengthened. Grade 2 sprains include a lengthened ligament, due to the ligament being stretched or partially ruptured. With grade 2 sprains there is still function, although the function may be decreased. Grade 3 sprains involve a complete tear of the tendon or muscle, and function is usually impaired. SYMPTOMS   Severe pain in the lower back.  Sometimes, a feeling of a "pop," "snap," or tear, at the time of injury.  Tenderness and sometimes swelling at the injury site.  Uncommonly, bruising (contusion) within 48 hours of injury.  Muscle spasms in the back. CAUSES  Low back sprains occur when a force is placed on the ligaments that is greater than they can handle. Common causes of injury include:  Performing a stressful act while off-balance.  Repetitive stressful activities that involve movement of the lower back.  Direct hit (trauma) to the lower back. RISK INCREASES WITH:  Contact sports (football, wrestling).  Collisions (major skiing accidents).  Sports that require throwing or lifting (baseball, weightlifting).  Sports involving twisting of the spine (gymnastics, diving, tennis, golf).  Poor strength and flexibility.  Inadequate protection.  Previous back injury or surgery (especially fusion). PREVENTION  Wear properly fitted and padded protective equipment.  Warm up and stretch properly before activity.  Allow for adequate recovery between workouts.  Maintain physical fitness:  Strength, flexibility, and endurance.  Cardiovascular fitness.  Maintain a healthy body weight. PROGNOSIS  If treated  properly, low back sprains usually heal with non-surgical treatment. The length of time for healing depends on the severity of the injury.  RELATED COMPLICATIONS   Recurring symptoms, resulting in a chronic problem.  Chronic inflammation and pain in the low back.  Delayed healing or resolution of symptoms, especially if activity is resumed too soon.  Prolonged impairment.  Unstable or arthritic joints of the low back. TREATMENT  Treatment first involves the use of ice and medicine, to reduce pain and inflammation. The use of strengthening and stretching exercises may help reduce pain with activity. These exercises may be performed at home or with a therapist. Severe injuries may require referral to a therapist for further evaluation and treatment, such as ultrasound. Your caregiver may advise that you wear a back brace or corset, to help reduce pain and discomfort. Often, prolonged bed rest results in greater harm then benefit. Corticosteroid injections may be recommended. However, these should be reserved for the most serious cases. It is important to avoid using your back when lifting objects. At night, sleep on your back on a firm mattress, with a pillow placed under your knees. If non-surgical treatment is unsuccessful, surgery may be needed.  MEDICATION   If pain medicine is needed, nonsteroidal anti-inflammatory medicines (aspirin and ibuprofen), or other minor pain relievers (acetaminophen), are often advised.  Do not take pain medicine for 7 days before surgery.  Prescription pain relievers may be given, if your caregiver thinks they are needed. Use only as directed and only as much as you need.  Ointments applied to the skin may be helpful.  Corticosteroid injections may be given by your caregiver. These injections should be reserved for the most serious cases,   because they may only be given a certain number of times. HEAT AND COLD  Cold treatment (icing) should be applied for 10  to 15 minutes every 2 to 3 hours for inflammation and pain, and immediately after activity that aggravates your symptoms. Use ice packs or an ice massage.  Heat treatment may be used before performing stretching and strengthening activities prescribed by your caregiver, physical therapist, or athletic trainer. Use a heat pack or a warm water soak. SEEK MEDICAL CARE IF:   Symptoms get worse or do not improve in 2 to 4 weeks, despite treatment.  You develop numbness or weakness in either leg.  You lose bowel or bladder function.  Any of the following occur after surgery: fever, increased pain, swelling, redness, drainage of fluids, or bleeding in the affected area.  New, unexplained symptoms develop. (Drugs used in treatment may produce side effects.) EXERCISES  RANGE OF MOTION (ROM) AND STRETCHING EXERCISES - Low Back Sprain Most people with lower back pain will find that their symptoms get worse with excessive bending forward (flexion) or arching at the lower back (extension). The exercises that will help resolve your symptoms will focus on the opposite motion.  Your physician, physical therapist or athletic trainer will help you determine which exercises will be most helpful to resolve your lower back pain. Do not complete any exercises without first consulting with your caregiver. Discontinue any exercises which make your symptoms worse, until you speak to your caregiver. If you have pain, numbness or tingling which travels down into your buttocks, leg or foot, the goal of the therapy is for these symptoms to move closer to your back and eventually resolve. Sometimes, these leg symptoms will get better, but your lower back pain may worsen. This is often an indication of progress in your rehabilitation. Be very alert to any changes in your symptoms and the activities in which you participated in the 24 hours prior to the change. Sharing this information with your caregiver will allow him or her to  most efficiently treat your condition. These exercises may help you when beginning to rehabilitate your injury. Your symptoms may resolve with or without further involvement from your physician, physical therapist or athletic trainer. While completing these exercises, remember:   Restoring tissue flexibility helps normal motion to return to the joints. This allows healthier, less painful movement and activity.  An effective stretch should be held for at least 30 seconds.  A stretch should never be painful. You should only feel a gentle lengthening or release in the stretched tissue. FLEXION RANGE OF MOTION AND STRETCHING EXERCISES: STRETCH - Flexion, Single Knee to Chest   Lie on a firm bed or floor with both legs extended in front of you.  Keeping one leg in contact with the floor, bring your opposite knee to your chest. Hold your leg in place by either grabbing behind your thigh or at your knee.  Pull until you feel a gentle stretch in your low back. Hold __________ seconds.  Slowly release your grasp and repeat the exercise with the opposite side. Repeat __________ times. Complete this exercise __________ times per day.  STRETCH - Flexion, Double Knee to Chest  Lie on a firm bed or floor with both legs extended in front of you.  Keeping one leg in contact with the floor, bring your opposite knee to your chest.  Tense your stomach muscles to support your back and then lift your other knee to your chest. Hold your legs   in place by either grabbing behind your thighs or at your knees.  Pull both knees toward your chest until you feel a gentle stretch in your low back. Hold __________ seconds.  Tense your stomach muscles and slowly return one leg at a time to the floor. Repeat __________ times. Complete this exercise __________ times per day.  STRETCH - Low Trunk Rotation  Lie on a firm bed or floor. Keeping your legs in front of you, bend your knees so they are both pointed toward the  ceiling and your feet are flat on the floor.  Extend your arms out to the side. This will stabilize your upper body by keeping your shoulders in contact with the floor.  Gently and slowly drop both knees together to one side until you feel a gentle stretch in your low back. Hold for __________ seconds.  Tense your stomach muscles to support your lower back as you bring your knees back to the starting position. Repeat the exercise to the other side. Repeat __________ times. Complete this exercise __________ times per day  EXTENSION RANGE OF MOTION AND FLEXIBILITY EXERCISES: STRETCH - Extension, Prone on Elbows   Lie on your stomach on the floor, a bed will be too soft. Place your palms about shoulder width apart and at the height of your head.  Place your elbows under your shoulders. If this is too painful, stack pillows under your chest.  Allow your body to relax so that your hips drop lower and make contact more completely with the floor.  Hold this position for __________ seconds.  Slowly return to lying flat on the floor. Repeat __________ times. Complete this exercise __________ times per day.  RANGE OF MOTION - Extension, Prone Press Ups  Lie on your stomach on the floor, a bed will be too soft. Place your palms about shoulder width apart and at the height of your head.  Keeping your back as relaxed as possible, slowly straighten your elbows while keeping your hips on the floor. You may adjust the placement of your hands to maximize your comfort. As you gain motion, your hands will come more underneath your shoulders.  Hold this position __________ seconds.  Slowly return to lying flat on the floor. Repeat __________ times. Complete this exercise __________ times per day.  RANGE OF MOTION- Quadruped, Neutral Spine   Assume a hands and knees position on a firm surface. Keep your hands under your shoulders and your knees under your hips. You may place padding under your knees for  comfort.  Drop your head and point your tailbone toward the ground below you. This will round out your lower back like an angry cat. Hold this position for __________ seconds.  Slowly lift your head and release your tail bone so that your back sags into a large arch, like an old horse.  Hold this position for __________ seconds.  Repeat this until you feel limber in your low back.  Now, find your "sweet spot." This will be the most comfortable position somewhere between the two previous positions. This is your neutral spine. Once you have found this position, tense your stomach muscles to support your low back.  Hold this position for __________ seconds. Repeat __________ times. Complete this exercise __________ times per day.  STRENGTHENING EXERCISES - Low Back Sprain These exercises may help you when beginning to rehabilitate your injury. These exercises should be done near your "sweet spot." This is the neutral, low-back arch, somewhere between fully rounded   and fully arched, that is your least painful position. When performed in this safe range of motion, these exercises can be used for people who have either a flexion or extension based injury. These exercises may resolve your symptoms with or without further involvement from your physician, physical therapist or athletic trainer. While completing these exercises, remember:   Muscles can gain both the endurance and the strength needed for everyday activities through controlled exercises.  Complete these exercises as instructed by your physician, physical therapist or athletic trainer. Increase the resistance and repetitions only as guided.  You may experience muscle soreness or fatigue, but the pain or discomfort you are trying to eliminate should never worsen during these exercises. If this pain does worsen, stop and make certain you are following the directions exactly. If the pain is still present after adjustments, discontinue the  exercise until you can discuss the trouble with your caregiver. STRENGTHENING - Deep Abdominals, Pelvic Tilt   Lie on a firm bed or floor. Keeping your legs in front of you, bend your knees so they are both pointed toward the ceiling and your feet are flat on the floor.  Tense your lower abdominal muscles to press your low back into the floor. This motion will rotate your pelvis so that your tail bone is scooping upwards rather than pointing at your feet or into the floor. With a gentle tension and even breathing, hold this position for __________ seconds. Repeat __________ times. Complete this exercise __________ times per day.  STRENGTHENING - Abdominals, Crunches   Lie on a firm bed or floor. Keeping your legs in front of you, bend your knees so they are both pointed toward the ceiling and your feet are flat on the floor. Cross your arms over your chest.  Slightly tip your chin down without bending your neck.  Tense your abdominals and slowly lift your trunk high enough to just clear your shoulder blades. Lifting higher can put excessive stress on the lower back and does not further strengthen your abdominal muscles.  Control your return to the starting position. Repeat __________ times. Complete this exercise __________ times per day.  STRENGTHENING - Quadruped, Opposite UE/LE Lift   Assume a hands and knees position on a firm surface. Keep your hands under your shoulders and your knees under your hips. You may place padding under your knees for comfort.  Find your neutral spine and gently tense your abdominal muscles so that you can maintain this position. Your shoulders and hips should form a rectangle that is parallel with the floor and is not twisted.  Keeping your trunk steady, lift your right hand no higher than your shoulder and then your left leg no higher than your hip. Make sure you are not holding your breath. Hold this position for __________ seconds.  Continuing to keep  your abdominal muscles tense and your back steady, slowly return to your starting position. Repeat with the opposite arm and leg. Repeat __________ times. Complete this exercise __________ times per day.  STRENGTHENING - Abdominals and Quadriceps, Straight Leg Raise   Lie on a firm bed or floor with both legs extended in front of you.  Keeping one leg in contact with the floor, bend the other knee so that your foot can rest flat on the floor.  Find your neutral spine, and tense your abdominal muscles to maintain your spinal position throughout the exercise.  Slowly lift your straight leg off the floor about 6 inches for a count   of 15, making sure to not hold your breath.  Still keeping your neutral spine, slowly lower your leg all the way to the floor. Repeat this exercise with each leg __________ times. Complete this exercise __________ times per day. POSTURE AND BODY MECHANICS CONSIDERATIONS - Low Back Sprain Keeping correct posture when sitting, standing or completing your activities will reduce the stress put on different body tissues, allowing injured tissues a chance to heal and limiting painful experiences. The following are general guidelines for improved posture. Your physician or physical therapist will provide you with any instructions specific to your needs. While reading these guidelines, remember:  The exercises prescribed by your provider will help you have the flexibility and strength to maintain correct postures.  The correct posture provides the best environment for your joints to work. All of your joints have less wear and tear when properly supported by a spine with good posture. This means you will experience a healthier, less painful body.  Correct posture must be practiced with all of your activities, especially prolonged sitting and standing. Correct posture is as important when doing repetitive low-stress activities (typing) as it is when doing a single heavy-load  activity (lifting). RESTING POSITIONS Consider which positions are most painful for you when choosing a resting position. If you have pain with flexion-based activities (sitting, bending, stooping, squatting), choose a position that allows you to rest in a less flexed posture. You would want to avoid curling into a fetal position on your side. If your pain worsens with extension-based activities (prolonged standing, working overhead), avoid resting in an extended position such as sleeping on your stomach. Most people will find more comfort when they rest with their spine in a more neutral position, neither too rounded nor too arched. Lying on a non-sagging bed on your side with a pillow between your knees, or on your back with a pillow under your knees will often provide some relief. Keep in mind, being in any one position for a prolonged period of time, no matter how correct your posture, can still lead to stiffness. PROPER SITTING POSTURE In order to minimize stress and discomfort on your spine, you must sit with correct posture. Sitting with good posture should be effortless for a healthy body. Returning to good posture is a gradual process. Many people can work toward this most comfortably by using various supports until they have the flexibility and strength to maintain this posture on their own. When sitting with proper posture, your ears will fall over your shoulders and your shoulders will fall over your hips. You should use the back of the chair to support your upper back. Your lower back will be in a neutral position, just slightly arched. You may place a small pillow or folded towel at the base of your lower back for  support.  When working at a desk, create an environment that supports good, upright posture. Without extra support, muscles tire, which leads to excessive strain on joints and other tissues. Keep these recommendations in mind: CHAIR:  A chair should be able to slide under your desk  when your back makes contact with the back of the chair. This allows you to work closely.  The chair's height should allow your eyes to be level with the upper part of your monitor and your hands to be slightly lower than your elbows. BODY POSITION  Your feet should make contact with the floor. If this is not possible, use a foot rest.  Keep your   ears over your shoulders. This will reduce stress on your neck and low back. INCORRECT SITTING POSTURES  If you are feeling tired and unable to assume a healthy sitting posture, do not slouch or slump. This puts excessive strain on your back tissues, causing more damage and pain. Healthier options include:  Using more support, like a lumbar pillow.  Switching tasks to something that requires you to be upright or walking.  Talking a brief walk.  Lying down to rest in a neutral-spine position. PROLONGED STANDING WHILE SLIGHTLY LEANING FORWARD  When completing a task that requires you to lean forward while standing in one place for a long time, place either foot up on a stationary 2-4 inch high object to help maintain the best posture. When both feet are on the ground, the lower back tends to lose its slight inward curve. If this curve flattens (or becomes too large), then the back and your other joints will experience too much stress, tire more quickly, and can cause pain. CORRECT STANDING POSTURES Proper standing posture should be assumed with all daily activities, even if they only take a few moments, like when brushing your teeth. As in sitting, your ears should fall over your shoulders and your shoulders should fall over your hips. You should keep a slight tension in your abdominal muscles to brace your spine. Your tailbone should point down to the ground, not behind your body, resulting in an over-extended swayback posture.  INCORRECT STANDING POSTURES  Common incorrect standing postures include a forward head, locked knees and/or an excessive  swayback. WALKING Walk with an upright posture. Your ears, shoulders and hips should all line-up. PROLONGED ACTIVITY IN A FLEXED POSITION When completing a task that requires you to bend forward at your waist or lean over a low surface, try to find a way to stabilize 3 out of 4 of your limbs. You can place a hand or elbow on your thigh or rest a knee on the surface you are reaching across. This will provide you more stability, so that your muscles do not tire as quickly. By keeping your knees relaxed, or slightly bent, you will also reduce stress across your lower back. CORRECT LIFTING TECHNIQUES DO :  Assume a wide stance. This will provide you more stability and the opportunity to get as close as possible to the object which you are lifting.  Tense your abdominals to brace your spine. Bend at the knees and hips. Keeping your back locked in a neutral-spine position, lift using your leg muscles. Lift with your legs, keeping your back straight.  Test the weight of unknown objects before attempting to lift them.  Try to keep your elbows locked down at your sides in order get the best strength from your shoulders when carrying an object.  Always ask for help when lifting heavy or awkward objects. INCORRECT LIFTING TECHNIQUES DO NOT:   Lock your knees when lifting, even if it is a small object.  Bend and twist. Pivot at your feet or move your feet when needing to change directions.  Assume that you can safely pick up even a paperclip without proper posture. Document Released: 12/25/2004 Document Revised: 03/19/2011 Document Reviewed: 04/08/2008 [employer] Patient Information 2015 [employer]. This information is not intended to replace advice given to you by your health care provider. Make sure you discuss any questions you have with your health care provider.  

## 2014-07-02 NOTE — Progress Notes (Signed)
Subjective:    Patient ID: Kristen Wu, female    DOB: Jul 19, 1975, 39 y.o.   MRN: [id number]  07/02/2014  Follow-up and Medication Refill   HPI This 39 y.o. female presents for one year follow-up:  1.  ADHD:  Adderall  bid; needs refill.  Last rx 05/2013.  Switched short acting last year; taking one bid.  Sometimes taking am dose only.  Suffers with dry mouth.  Sores in mouth.  Trying Biotin gum.    2. Anxiety:  Weaned self off of Zoloft.  Doing well.  No irritability or anxiety.  Weaned off 2 weeks ago.  Took  for two weeks; then took  qod and the every three days.    3. Nephrolithiasis:    4.  Allergic Rhinitis:  Allergies have been horrible. Needs refil of Flonase, Xyzal, Singulair.  5. Asthma: rare Albuterol use; taking Singulair; Albuterol makes jittery.    6.  Lower back pain: hurting a lot; B lower back pain and R thoracic pain.  Sometimes feels like kidney issues.  No injury. Onset several months.  Intermittent.  Stiff upon standing after prolonged sitting.  At work, sits on stool to work.  If sits upright, back with start hurting.  No radiation; no n/t/w.  Normal B/B function; no saddle paresthesias.  Taking Tylenol. Will take 1/2 Norco 5/325; had 50 Norco from R elbow surgery; has several left over.   7. Tension headaches: has not Tizanadine; ran out of medication a while ago.  Not very compliant.  8.  Sinusitis: taking Minocycline prescribed Kristen Wu at [facility].  Still having headaches daily.  Evaluated last Thursday; has ten days of Minocycline.    Review of Systems  Past Medical History  Diagnosis Date  . Mitral valve prolapse   . Abnormal Pap smear   . Asthma   . Anxiety   . Nephrolithiasis   . Allergy    Past Surgical History  Procedure Laterality Date  . Colposcopy    . Laparoscopy      QUESTIONABLE ECTOPIC   . Knee surgery      right   Allergies  Allergen Reactions  . Sulfa Drugs Cross Reactors Rash   Current Outpatient  Prescriptions  Medication Sig Dispense Refill  . ALPRAZolam (NIRAVAM) 0.5 MG dissolvable tablet Take 1 tablet (0.5 mg total) by mouth 2 (two) times daily as needed for anxiety. 10 tablet 0  . amphetamine-dextroamphetamine (ADDERALL) 10 MG tablet Take 1 tablet (10 mg total) by mouth 2 (two) times daily with a meal. 60 tablet 0  . levonorgestrel (MIRENA) 20 MCG/24HR IUD 1 each by Intrauterine route once.    Marland Kitchen PRESCRIPTION MEDICATION Ventolin HFA inhaler 1-2 puffs every 4-6 hours prn    . tiZANidine (ZANAFLEX) 4 MG tablet Take 1 tablet (4 mg total) by mouth at bedtime. PATIENT NEEDS OFFICE VISIT FOR ADDITIONAL REFILLS 30 tablet 5  . fluticasone (FLONASE) 50 MCG/ACT nasal spray Place 2 sprays into both nostrils daily. 16 g 11  . levocetirizine (XYZAL) 5 MG tablet Take 1 tablet (5 mg total) by mouth every evening. 30 tablet 11  . meloxicam (MOBIC) 15 MG tablet Take 1 tablet (15 mg total) by mouth daily. 30 tablet 1  . montelukast (SINGULAIR) 10 MG tablet 1 TABLET IN THE EVENING ONCE A DAY 30 tablet 11  . sertraline (ZOLOFT) 50 MG tablet Take 1 tablet (50 mg total) by mouth daily. (Patient not taking: Reported on 07/02/2014) 30 tablet 5  .  VENTOLIN HFA 108 (90 BASE) MCG/ACT inhaler INHALE 2 PUFFS 4 TIMES A DAY 18 Inhaler 1  . zolpidem (AMBIEN) 10 MG tablet TAKE 1 TABLET BY MOUTH AT BEDTIME (Patient not taking: Reported on 07/02/2014) 30 tablet 5   No current facility-administered medications for this visit.       Objective:    BP 116/71 mmHg  Pulse 69  Temp(Src) 99.5 F (37.5 C) (Oral)  Resp 16  Ht 5' 5.5" (1.664 m)  Wt 144 lb (65.318 kg)  BMI 23.59 kg/m2  SpO2 98% Physical Exam Results for orders placed or performed in visit on 08/31/13  CBC with Differential/Platelet  Result Value Ref Range   WBC 6.6 3.6-11.0 x10 3/mm 3   RBC 4.08 3.80-5.20 X10 6/mm 3   HGB 12.9 12.0-16.0 g/dL   HCT 21.3 08.6-57.8 %   MCV 94 80-100 fL   MCH 31.5 26.0-34.0 pg   MCHC 33.6 32.0-36.0 g/dL   RDW 46.9  62.9-52.8 %   Platelet 231 150-440 x10 3/mm 3   Neutrophil % 63.6 %   Lymphocyte % 23.5 %   Monocyte % 9.2 %   Eosinophil % 2.9 %   Basophil % 0.8 %   Neutrophil # 4.2 1.4-6.5 x10 3/mm 3   Lymphocyte # 1.5 1.0-3.6 x10 3/mm 3   Monocyte # 0.6 0.2-0.9 x10 3/mm    Eosinophil # 0.2 0.0-0.7 x10 3/mm 3   Basophil # 0.1 0.0-0.1 x10 3/mm 3       Assessment & Plan:   1. Tension headache   2. Nephrolithiasis   3. Mitral valve prolapse   4. Anxiety   5. Allergic rhinitis due to pollen   6. Attention deficit hyperactivity disorder (ADHD), combined type   7. Asthma, mild intermittent, uncomplicated   8. Lumbar strain, initial encounter   9. Encounter for screening mammogram for breast cancer     Meds ordered this encounter  Medications  . DISCONTD: amphetamine-dextroamphetamine (ADDERALL) 10 MG tablet    Sig: Take 1 tablet (10 mg total) by mouth 2 (two) times daily with a meal.    Dispense:  60 tablet    Refill:  0  . DISCONTD: amphetamine-dextroamphetamine (ADDERALL) 10 MG tablet    Sig: Take 1 tablet (10 mg total) by mouth 2 (two) times daily with a meal.    Dispense:  60 tablet    Refill:  0    DO NOT FILL UNTIL 08-01-2014  . amphetamine-dextroamphetamine (ADDERALL) 10 MG tablet    Sig: Take 1 tablet (10 mg total) by mouth 2 (two) times daily with a meal.    Dispense:  60 tablet    Refill:  0    DO NOT FILL UNTIL 09-01-2014  . fluticasone (FLONASE) 50 MCG/ACT nasal spray    Sig: Place 2 sprays into both nostrils daily.    Dispense:  16 g    Refill:  11  . levocetirizine (XYZAL) 5 MG tablet    Sig: Take 1 tablet (5 mg total) by mouth every evening.    Dispense:  30 tablet    Refill:  11  . tiZANidine (ZANAFLEX) 4 MG tablet    Sig: Take 1 tablet (4 mg total) by mouth at bedtime. PATIENT NEEDS OFFICE VISIT FOR ADDITIONAL REFILLS    Dispense:  30 tablet    Refill:  5  . meloxicam (MOBIC) 15 MG tablet    Sig: Take 1 tablet (15 mg total) by mouth daily.    Dispense:  30 tablet  Refill:  1    Return in about 6 months (around 01/01/2015) for recheck.    [name], M.D. [facility] [address] [place], [place]  [address] [phone] phone [phone] fax

## 2014-07-03 ENCOUNTER — Other Ambulatory Visit: Payer: Self-pay | Admitting: Family Medicine

## 2014-07-04 NOTE — Telephone Encounter (Signed)
Patient was seen on 6/24 can we refill Ventolin HFA inhaler and singular for 6 months?

## 2014-07-05 NOTE — Telephone Encounter (Signed)
Rx approved

## 2014-07-09 ENCOUNTER — Other Ambulatory Visit: Payer: Self-pay | Admitting: Family Medicine

## 2014-07-09 NOTE — Telephone Encounter (Signed)
Please call in refill of Alprazolam as approved. 

## 2014-07-09 NOTE — Telephone Encounter (Signed)
Called in.

## 2014-08-09 ENCOUNTER — Other Ambulatory Visit: Payer: Self-pay | Admitting: Family Medicine

## 2014-09-21 ENCOUNTER — Other Ambulatory Visit: Payer: Self-pay | Admitting: Family Medicine

## 2014-11-01 ENCOUNTER — Telehealth: Payer: Self-pay | Admitting: Family Medicine

## 2014-11-01 NOTE — Telephone Encounter (Signed)
Pt voice mail box full on 11/01/14 @ 5:37 please tell patient to reschedule her appt that she had with Dr Katrinka BlazingSmith on 01/07/15 for a OV

## 2014-11-29 ENCOUNTER — Other Ambulatory Visit: Payer: Self-pay | Admitting: Family Medicine

## 2014-11-30 NOTE — Telephone Encounter (Signed)
Please call or fax into pharmacy Xanax refill as approved.

## 2014-12-01 NOTE — Telephone Encounter (Signed)
Faxed

## 2015-01-05 ENCOUNTER — Ambulatory Visit: Payer: BLUE CROSS/BLUE SHIELD | Admitting: Family Medicine

## 2015-01-07 ENCOUNTER — Ambulatory Visit: Payer: BLUE CROSS/BLUE SHIELD | Admitting: Family Medicine

## 2015-01-18 ENCOUNTER — Other Ambulatory Visit: Payer: Self-pay | Admitting: Family Medicine

## 2015-02-15 ENCOUNTER — Other Ambulatory Visit: Payer: Self-pay | Admitting: Physician Assistant

## 2015-08-03 ENCOUNTER — Other Ambulatory Visit: Payer: Self-pay | Admitting: Family Medicine

## 2015-10-03 ENCOUNTER — Other Ambulatory Visit: Payer: Self-pay | Admitting: Family Medicine

## 2016-01-03 ENCOUNTER — Other Ambulatory Visit: Payer: Self-pay | Admitting: Family Medicine

## 2016-04-14 ENCOUNTER — Ambulatory Visit (INDEPENDENT_AMBULATORY_CARE_PROVIDER_SITE_OTHER): Payer: BLUE CROSS/BLUE SHIELD | Admitting: Family Medicine

## 2016-04-14 ENCOUNTER — Encounter: Payer: Self-pay | Admitting: Family Medicine

## 2016-04-14 VITALS — BP 122/73 | HR 58 | Temp 99.0°F | Resp 16 | Ht 65.5 in | Wt 144.8 lb

## 2016-04-14 DIAGNOSIS — F419 Anxiety disorder, unspecified: Secondary | ICD-10-CM | POA: Diagnosis not present

## 2016-04-14 DIAGNOSIS — J301 Allergic rhinitis due to pollen: Secondary | ICD-10-CM | POA: Diagnosis not present

## 2016-04-14 DIAGNOSIS — I341 Nonrheumatic mitral (valve) prolapse: Secondary | ICD-10-CM

## 2016-04-14 DIAGNOSIS — G44209 Tension-type headache, unspecified, not intractable: Secondary | ICD-10-CM

## 2016-04-14 DIAGNOSIS — F902 Attention-deficit hyperactivity disorder, combined type: Secondary | ICD-10-CM

## 2016-04-14 DIAGNOSIS — J4520 Mild intermittent asthma, uncomplicated: Secondary | ICD-10-CM | POA: Diagnosis not present

## 2016-04-14 DIAGNOSIS — S0990XA Unspecified injury of head, initial encounter: Secondary | ICD-10-CM | POA: Diagnosis not present

## 2016-04-14 DIAGNOSIS — F5104 Psychophysiologic insomnia: Secondary | ICD-10-CM | POA: Diagnosis not present

## 2016-04-14 MED ORDER — FLUTICASONE PROPIONATE 50 MCG/ACT NA SUSP: 2.0000 | Freq: Every day | NASAL | 3 refills | Status: DC

## 2016-04-14 MED ORDER — ALBUTEROL SULFATE HFA 108 (90 BASE) MCG/ACT IN AERS: INHALATION_SPRAY | RESPIRATORY_TRACT | 1 refills | Status: DC

## 2016-04-14 MED ORDER — METHYLPHENIDATE HCL ER 18 MG PO TB24: 18.0000 mg | ORAL_TABLET | Freq: Every day | ORAL | 0 refills | Status: DC

## 2016-04-14 MED ORDER — ALPRAZOLAM 0.5 MG PO TABS: ORAL_TABLET | ORAL | 0 refills | Status: DC

## 2016-04-14 MED ORDER — LEVOCETIRIZINE DIHYDROCHLORIDE 5 MG PO TABS: 5.0000 mg | ORAL_TABLET | Freq: Every evening | ORAL | 3 refills | Status: DC

## 2016-04-14 MED ORDER — TIZANIDINE HCL 4 MG PO TABS
ORAL_TABLET | ORAL | 0 refills | Status: DC
Start: 2016-04-14 — End: 2016-05-12

## 2016-04-14 MED ORDER — ZOLPIDEM TARTRATE 10 MG PO TABS
10.0000 mg | ORAL_TABLET | Freq: Every day | ORAL | 1 refills | Status: DC
Start: 2016-04-14 — End: 2017-03-05

## 2016-04-14 MED ORDER — MONTELUKAST SODIUM 10 MG PO TABS: ORAL_TABLET | ORAL | 3 refills | Status: DC

## 2016-04-14 NOTE — Progress Notes (Signed)
Subjective:    Patient ID: Kristen Wu, female    DOB: 1975/11/13, 41 y.o.   MRN: [id number]  04/14/2016  Medication Refill (singulair, flonase, xyzal); Stress; and ADHD   HPI This 41 y.o. female presents for follow-up for head trauma event in February and medication refills for allergy medications.  12/07/2016grandfather hit head and died at the age of 89.  Passed out and fell backwards; was flat; did not try to break his fall.  February 2017, father had heart attack with triple bypass; father lost his house in May; mother and grandmother moved in with patient; father stayed in [place] renovating a home to move into.  07/07/17friend had a CVA and died at age 42.  Took care of four boys for a long time; their father took third shift; knew nothing about the house or family.  Grandmother had a TIA.  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  Child's rat died so daughter struggling emotionally.  February 2018 pt hit head; putting dog into crate and hit head and suffered loss of conscious.  No evaluation at that time.  Had a dent in head; got up; no syncope; no n/v; no dizziness; no blurred vision; no persistent headache; no evaluation with injury.  Quit [facility] in April 2017.  Had been with [facility] for 20 years; now work at [employer]. Two weeks ago, child had pituitary brain bleed; awoke with severe headache.  Daughter who is 24 years old went into work; sent home; went to [employer] office; sent to hospital in [place] where in school; CT head negative; sent home with medication; the next day, headache still there; prescribed Prednisone; drove daughter to [facility]; MRI brain performed and discharged; attending the next morning read that MRI with bleed; followed up on Tuesday; +bleed with pituitary tumor.  Went to neurology office yesterday; cortisol levels are very low; thyroid off; not sure if surgery indicated.  Tumor is not close to optic nerve so going to monitor.  Bleeding is stable.  Repeat MRI  in 2016-07-14.  Will be going to [employer] for premedical program there.   Emotionally has been stable other than this week with daughter.  Happy with [facility].  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   Much less volume.     There is no immunization history on file for this patient. BP Readings from Last 3 Encounters:  04/14/16 122/73  07/02/14 116/71  05/20/13 108/64   Wt Readings from Last 3 Encounters:  04/14/16 144 lb 12.8 oz (65.7 kg)  07/02/14 144 lb (65.3 kg)  05/20/13 145 lb 12.8 oz (66.1 kg)    Review of Systems  Constitutional: Negative for chills, diaphoresis, fatigue and fever.  Eyes: Negative for visual disturbance.  Respiratory: Negative for cough and shortness of breath.   Cardiovascular: Negative for chest pain, palpitations and leg swelling.  Gastrointestinal: Negative for abdominal pain, constipation, diarrhea, nausea and vomiting.  Endocrine: Negative for cold intolerance, heat intolerance, polydipsia, polyphagia and polyuria.  Musculoskeletal: Positive for neck pain and neck stiffness.  Neurological: Positive for headaches. Negative for dizziness, tremors, seizures, syncope, facial asymmetry, speech difficulty, weakness, light-headedness and numbness.  Psychiatric/Behavioral: Positive for decreased concentration. Negative for dysphoric mood, self-injury, sleep disturbance and suicidal ideas. The patient is nervous/anxious.     Past Medical History:  Diagnosis Date  . Abnormal Pap smear   . ADHD   . Allergy   . Anxiety   .  Asthma   . Mitral valve prolapse   . Nephrolithiasis    Past Surgical History:  Procedure Laterality Date  . COLPOSCOPY    . KNEE SURGERY     right  . LAPAROSCOPY     QUESTIONABLE ECTOPIC    Allergies  Allergen Reactions  . Sulfa Drugs Cross Reactors Rash    Social History   Social History  . Marital status: Married    Spouse name: N/A  . Number of children: N/A  . Years of education: N/A   Occupational History    . Not on file.   Social History Main Topics  . Smoking status: Never Smoker  . Smokeless tobacco: Never Used  . Alcohol use 0.0 oz/week    1 - 2 Standard drinks or equivalent per week     Comment: occassionally  . Drug use: No  . Sexual activity: Yes    Partners: Male    Birth control/ protection: IUD   Other Topics Concern  . Not on file   Social History Narrative  . No narrative on file   Family History  Problem Relation Age of Onset  . Hypertension Father   . Arthritis Father   . Heart disease Father   . Hyperlipidemia Father   . Cancer Maternal Grandmother        BREAST   . Hypertension Maternal Grandmother   . Depression Mother   . Asthma Brother   . Asthma Daughter   . Cancer Maternal Uncle        stomach  . Heart disease Paternal Uncle        heart attack  . Hypertension Paternal Uncle   . Hyperlipidemia Paternal Uncle   . Cancer Paternal Grandfather        prostate  . Heart disease Paternal Grandfather        heart attack  . Hypertension Maternal Grandfather        Objective:    BP 122/73   Pulse (!) 58   Temp 99 F (37.2 C) (Oral)   Resp 16   Ht 5' 5.5" (1.664 m)   Wt 144 lb 12.8 oz (65.7 kg)   SpO2 100%   BMI 23.73 kg/m  Physical Exam  Constitutional: She is oriented to person, place, and time. She appears well-developed and well-nourished. No distress.  HENT:  Head: Normocephalic and atraumatic.  Right Ear: External ear normal.  Left Ear: External ear normal.  Nose: Nose normal.  Mouth/Throat: Oropharynx is clear and moist.  Eyes: Conjunctivae and EOM are normal. Pupils are equal, round, and reactive to light.  Neck: Normal range of motion. Neck supple. Carotid bruit is not present. No thyromegaly present.  Cardiovascular: Normal rate, regular rhythm, normal heart sounds and intact distal pulses.  Exam reveals no gallop and no friction rub.   No murmur heard. Pulmonary/Chest: Effort normal and breath sounds normal. She has no wheezes.  She has no rales.  Abdominal: Soft. Bowel sounds are normal. She exhibits no distension and no mass. There is no tenderness. There is no rebound and no guarding.  Musculoskeletal:       Right shoulder: Normal.       Left shoulder: Normal.       Cervical back: She exhibits pain and spasm. She exhibits normal range of motion, no tenderness and no bony tenderness.  Lymphadenopathy:    She has no cervical adenopathy.  Neurological: She is alert and oriented to person, place, and time. No cranial nerve deficit.  Coordination normal.  Skin: Skin is warm and dry. No rash noted. She is not diaphoretic. No erythema. No pallor.  Psychiatric: She has a normal mood and affect. Her behavior is normal. Judgment and thought content normal.     Depression screen [facility] 2/9 04/14/2016 07/02/2014  Decreased Interest 0 0  Down, Depressed, Hopeless 0 0  PHQ - 2 Score 0 0   Fall Risk  04/14/2016 07/02/2014  Falls in the past year? No Yes  Number falls in past yr: - 1  Injury with Fall? - Yes       Assessment & Plan:   1. Chronic seasonal allergic rhinitis due to pollen   2. Mild intermittent asthma without complication   3. Attention deficit hyperactivity disorder (ADHD), combined type   4. Anxiety   5. Tension headache   6. Mitral valve prolapse   7. Psychophysiological insomnia   8. Head trauma, initial encounter    -s/p head trauma two months ago with no evidence of concussive symptoms.  Doing well; neurologically intact. -recent family stressors multiple coping well with stressors yet intermittent anxiety and insomnia; rx refill of Xanax and Ambien provided. -recent worsening off allergy symptoms; refills of Flonase, Xyzal, and Singulair provided. -rx for Concerta  provided for ADHD.  -refill of Zanaflex provided for tension headaches; s/p neurological consultation in the past by Dr. Sherryll Burger. -[place] Controlled Substance Registry reviewed; no controlled substances filled in the past six months.   No  orders of the defined types were placed in this encounter.  Meds ordered this encounter  Medications  . ALPRAZolam (XANAX) 0.5 MG tablet    Sig: TAKE 1 TABLET BY MOUTH TWICE A DAY AS NEEDED FOR ANXIETY    Dispense:  30 tablet    Refill:  0  . fluticasone (FLONASE) 50 MCG/ACT nasal spray    Sig: Place 2 sprays into both nostrils daily.    Dispense:  48 g    Refill:  3  . levocetirizine (XYZAL) 5 MG tablet    Sig: Take 1 tablet (5 mg total) by mouth every evening.    Dispense:  90 tablet    Refill:  3  . montelukast (SINGULAIR) 10 MG tablet    Sig: TAKE 1 TABLET EVERY DAY IN THE EVENING    Dispense:  90 tablet    Refill:  3  . albuterol (VENTOLIN HFA) 108 (90 Base) MCG/ACT inhaler    Sig: INHALE 2 PUFFS 4 TIMES A DAY    Dispense:  18 Inhaler    Refill:  1  . DISCONTD: tiZANidine (ZANAFLEX) 4 MG tablet    Sig: TAKE 1 TABLET (4 MG TOTAL) BY MOUTH AT BEDTIME.    Dispense:  30 tablet    Refill:  0  . zolpidem (AMBIEN) 10 MG tablet    Sig: Take 1 tablet (10 mg total) by mouth at bedtime.    Dispense:  30 tablet    Refill:  1  . methylphenidate 18 MG PO CR tablet    Sig: Take 1 tablet (18 mg total) by mouth daily.    Dispense:  30 tablet    Refill:  0    Return in about 4 months (around 08/14/2016) for recheck.   Kristen Wu Paulita Fujita, M.D. [facility] [address] [place], [place]  [address] [phone] phone [phone] fax

## 2016-04-14 NOTE — Patient Instructions (Signed)
     IF you received an x-ray today, you will receive an invoice from [facility]. Please contact [facility] at [phone] with questions or concerns regarding your invoice.   IF you received labwork today, you will receive an invoice from [facility]. Please contact [facility] at [phone] with questions or concerns regarding your invoice.   Our billing staff will not be able to assist you with questions regarding bills from these companies.  You will be contacted with the lab results as soon as they are available. The fastest way to get your results is to activate your My Chart account. Instructions are located on the last page of this paperwork. If you have not heard from us regarding the results in 2 weeks, please contact this office.     

## 2016-05-12 ENCOUNTER — Other Ambulatory Visit: Payer: Self-pay | Admitting: Family Medicine

## 2016-05-12 NOTE — Telephone Encounter (Signed)
04/14/16 last ov and refill

## 2016-05-21 ENCOUNTER — Encounter: Payer: Self-pay | Admitting: Family Medicine

## 2016-05-21 DIAGNOSIS — F5104 Psychophysiologic insomnia: Secondary | ICD-10-CM | POA: Insufficient documentation

## 2016-05-25 ENCOUNTER — Other Ambulatory Visit: Payer: Self-pay

## 2016-05-26 MED ORDER — EPINEPHRINE 0.3 MG/0.3ML IJ SOAJ: 0.3000 mg | Freq: Once | INTRAMUSCULAR | 1 refills | Status: AC

## 2016-06-29 ENCOUNTER — Telehealth: Payer: Self-pay | Admitting: Family Medicine

## 2016-06-29 NOTE — Telephone Encounter (Signed)
Please fill accordingly

## 2016-06-29 NOTE — Telephone Encounter (Signed)
Pt wanted to know is she could get a refill on ADDERALL XR) 20 MG 24 hr capsule [[id number]  Please advise: [phone]

## 2016-07-06 MED ORDER — METHYLPHENIDATE HCL ER 27 MG PO TB24: 27.0000 mg | ORAL_TABLET | Freq: Every day | ORAL | 0 refills | Status: DC

## 2016-07-06 NOTE — Telephone Encounter (Signed)
Called and spoke with patient, she stated she thinks that Concerta is not effective for her. Maybe to increase the doze . She taught Adderall may be better , she used 2 years ago, but she  can't remember how effective was that either. She is willing to have an OV if need it. Please advise. Thanks.

## 2016-07-06 NOTE — Telephone Encounter (Signed)
Call --- if Concerta 18mg  is not effective, let's increase the dose of Concerta to 27mg  daily.  Rx is ready for pick up.

## 2016-07-06 NOTE — Telephone Encounter (Signed)
Call patient --- I received request for Adderall rx; we tried Concerta 18mg  at her visit in April 2018.  Please get details; did she have side effects to Concerta or was it not effective?

## 2016-07-06 NOTE — Telephone Encounter (Signed)
Called and informed patient of her new prescription.

## 2016-07-09 ENCOUNTER — Other Ambulatory Visit: Payer: Self-pay

## 2016-07-09 ENCOUNTER — Other Ambulatory Visit: Payer: Self-pay | Admitting: Family Medicine

## 2016-07-09 MED ORDER — TIZANIDINE HCL 4 MG PO TABS: 4.0000 mg | ORAL_TABLET | Freq: Every day | ORAL | 0 refills | Status: DC

## 2016-07-09 MED ORDER — METHYLPHENIDATE HCL ER 27 MG PO TB24: 27.0000 mg | ORAL_TABLET | Freq: Every day | ORAL | 0 refills | Status: DC

## 2016-07-09 NOTE — Progress Notes (Unsigned)
Rx faxed to us from pharmacy, insurance requiring 90 day supply.

## 2016-09-25 ENCOUNTER — Telehealth: Payer: Self-pay | Admitting: *Deleted

## 2016-09-25 NOTE — Telephone Encounter (Signed)
Dr. Katrinka Blazing  Pharmacy sent a paper request for Methylphenidate

## 2016-09-26 MED ORDER — METHYLPHENIDATE HCL ER 27 MG PO TB24: 27.0000 mg | ORAL_TABLET | Freq: Every day | ORAL | 0 refills | Status: DC

## 2016-09-26 NOTE — Telephone Encounter (Signed)
Call --- Methylphenidate rx is ready for pick up.  Also, patient will need a follow-up appointment for further refills. (Last visit in 04/2016).

## 2016-09-27 NOTE — Telephone Encounter (Signed)
Talked to pt about picking up Rx she informed me she will pick up today before close.

## 2016-09-27 NOTE — Telephone Encounter (Signed)
Tried to call pt about picking up Rx no answer will try again later.

## 2016-11-17 ENCOUNTER — Other Ambulatory Visit: Payer: Self-pay | Admitting: Family Medicine

## 2016-12-13 ENCOUNTER — Telehealth: Payer: Self-pay | Admitting: Family Medicine

## 2016-12-13 NOTE — Telephone Encounter (Signed)
Copied from CRM [phone]#[id number]. Topic: Quick Communication - See Telephone Encounter >> Dec 13, 2016  2:40 PM Guinevere FerrariMorris, Jowanna Loeffler E, NT wrote: CRM for notification. See Telephone encounter for: Pt is calling to see if she can get a refill on methylphenidate 27 MG PO TB24. [facility] [place] [facility] [address]  [facility]/13/16.

## 2016-12-18 NOTE — Telephone Encounter (Signed)
Please advise on Methylphenidate refill. Last OV ws 04/14/16. Did not have the 4 month check as advised.

## 2016-12-21 NOTE — Telephone Encounter (Signed)
Call -- patient is overdue for six month follow-up of Methylphenidate; thus, please schedule an appointment with me for follow-up on ADHD.

## 2016-12-24 ENCOUNTER — Telehealth: Payer: Self-pay | Admitting: Family Medicine

## 2016-12-24 NOTE — Telephone Encounter (Signed)
Called pt. I have her scheduled for beginning of January with Dr. Katrinka BlazingSmith.

## 2016-12-24 NOTE — Telephone Encounter (Signed)
Called pt. And rescheduled her for beginning of January with Dr. Katrinka BlazingSmith.

## 2017-01-09 ENCOUNTER — Ambulatory Visit: Payer: BLUE CROSS/BLUE SHIELD | Admitting: Family Medicine

## 2017-01-14 ENCOUNTER — Other Ambulatory Visit: Payer: Self-pay | Admitting: Family Medicine

## 2017-02-12 ENCOUNTER — Telehealth: Payer: Self-pay

## 2017-02-12 NOTE — Telephone Encounter (Signed)
Pt written rx for methylphenidate written on 09/26/16 has been shredded from 104 bin.

## 2017-02-13 ENCOUNTER — Other Ambulatory Visit: Payer: Self-pay

## 2017-02-13 ENCOUNTER — Ambulatory Visit: Payer: BLUE CROSS/BLUE SHIELD | Admitting: Family Medicine

## 2017-02-13 ENCOUNTER — Encounter: Payer: Self-pay | Admitting: Family Medicine

## 2017-02-13 VITALS — BP 108/68 | HR 70 | Temp 98.0°F | Resp 16 | Ht 65.95 in | Wt 140.0 lb

## 2017-02-13 DIAGNOSIS — F902 Attention-deficit hyperactivity disorder, combined type: Secondary | ICD-10-CM | POA: Diagnosis not present

## 2017-02-13 DIAGNOSIS — S43421A Sprain of right rotator cuff capsule, initial encounter: Secondary | ICD-10-CM | POA: Diagnosis not present

## 2017-02-13 DIAGNOSIS — F0781 Postconcussional syndrome: Secondary | ICD-10-CM | POA: Diagnosis not present

## 2017-02-13 DIAGNOSIS — F419 Anxiety disorder, unspecified: Secondary | ICD-10-CM

## 2017-02-13 DIAGNOSIS — G44209 Tension-type headache, unspecified, not intractable: Secondary | ICD-10-CM | POA: Diagnosis not present

## 2017-02-13 DIAGNOSIS — G5621 Lesion of ulnar nerve, right upper limb: Secondary | ICD-10-CM

## 2017-02-13 DIAGNOSIS — J301 Allergic rhinitis due to pollen: Secondary | ICD-10-CM

## 2017-02-13 DIAGNOSIS — J4520 Mild intermittent asthma, uncomplicated: Secondary | ICD-10-CM | POA: Diagnosis not present

## 2017-02-13 MED ORDER — TIZANIDINE HCL 4 MG PO TABS: 4.0000 mg | ORAL_TABLET | Freq: Three times a day (TID) | ORAL | 0 refills | Status: DC | PRN

## 2017-02-13 MED ORDER — METHYLPHENIDATE HCL ER 27 MG PO TB24: 27.0000 mg | ORAL_TABLET | Freq: Every day | ORAL | 0 refills | Status: DC

## 2017-02-13 NOTE — Patient Instructions (Addendum)
   IF you received an x-ray today, you will receive an invoice from [facility]. Please contact [facility] at [phone] with questions or concerns regarding your invoice.   IF you received labwork today, you will receive an invoice from [facility]. Please contact [facility] at [phone] with questions or concerns regarding your invoice.   Our billing staff will not be able to assist you with questions regarding bills from these companies.  You will be contacted with the lab results as soon as they are available. The fastest way to get your results is to activate your My Chart account. Instructions are located on the last page of this paperwork. If you have not heard from us regarding the results in 2 weeks, please contact this office.     Shoulder Exercises Ask your health care provider which exercises are safe for you. Do exercises exactly as told by your health care provider and adjust them as directed. It is normal to feel mild stretching, pulling, tightness, or discomfort as you do these exercises, but you should stop right away if you feel sudden pain or your pain gets worse.Do not begin these exercises until told by your health care provider. RANGE OF MOTION EXERCISES These exercises warm up your muscles and joints and improve the movement and flexibility of your shoulder. These exercises also help to relieve pain, numbness, and tingling. These exercises involve stretching your injured shoulder directly. Exercise A: Pendulum  1. Stand near a wall or a surface that you can hold onto for balance. 2. Bend at the waist and let your left / right arm hang straight down. Use your other arm to support you. Keep your back straight and do not lock your knees. 3. Relax your left / right arm and shoulder muscles, and move your hips and your trunk so your left / right arm swings freely. Your arm should swing because of the motion of your body, not because you are using your arm  or shoulder muscles. 4. Keep moving your body so your arm swings in the following directions, as told by your health care provider: ? Side to side. ? Forward and backward. ? In clockwise and counterclockwise circles. 5. Continue each motion for __________ seconds, or for as long as told by your health care provider. 6. Slowly return to the starting position. Repeat __________ times. Complete this exercise __________ times a day. Exercise B:Flexion, Standing  1. Stand and hold a broomstick, a cane, or a similar object. Place your hands a little more than shoulder-width apart on the object. Your left / right hand should be palm-up, and your other hand should be palm-down. 2. Keep your elbow straight and keep your shoulder muscles relaxed. Push the stick down with your healthy arm to raise your left / right arm in front of your body, and then over your head until you feel a stretch in your shoulder. ? Avoid shrugging your shoulder while you raise your arm. Keep your shoulder blade tucked down toward the middle of your back. 3. Hold for __________ seconds. 4. Slowly return to the starting position. Repeat __________ times. Complete this exercise __________ times a day. Exercise C: Abduction, Standing 1. Stand and hold a broomstick, a cane, or a similar object. Place your hands a little more than shoulder-width apart on the object. Your left / right hand should be palm-up, and your other hand should be palm-down. 2. While keeping your elbow straight and your shoulder muscles relaxed, push the stick   across your body toward your left / right side. Raise your left / right arm to the side of your body and then over your head until you feel a stretch in your shoulder. ? Do not raise your arm above shoulder height, unless your health care provider tells you to do that. ? Avoid shrugging your shoulder while you raise your arm. Keep your shoulder blade tucked down toward the middle of your back. 3. Hold for  __________ seconds. 4. Slowly return to the starting position. Repeat __________ times. Complete this exercise __________ times a day. Exercise D:Internal Rotation  1. Place your left / right hand behind your back, palm-up. 2. Use your other hand to dangle an exercise band, a towel, or a similar object over your shoulder. Grasp the band with your left / right hand so you are holding onto both ends. 3. Gently pull up on the band until you feel a stretch in the front of your left / right shoulder. ? Avoid shrugging your shoulder while you raise your arm. Keep your shoulder blade tucked down toward the middle of your back. 4. Hold for __________ seconds. 5. Release the stretch by letting go of the band and lowering your hands. Repeat __________ times. Complete this exercise __________ times a day. STRETCHING EXERCISES These exercises warm up your muscles and joints and improve the movement and flexibility of your shoulder. These exercises also help to relieve pain, numbness, and tingling. These exercises are done using your healthy shoulder to help stretch the muscles of your injured shoulder. Exercise E: Corner Stretch (External Rotation and Abduction)  1. Stand in a doorway with one of your feet slightly in front of the other. This is called a staggered stance. If you cannot reach your forearms to the door frame, stand facing a corner of a room. 2. Choose one of the following positions as told by your health care provider: ? Place your hands and forearms on the door frame above your head. ? Place your hands and forearms on the door frame at the height of your head. ? Place your hands on the door frame at the height of your elbows. 3. Slowly move your weight onto your front foot until you feel a stretch across your chest and in the front of your shoulders. Keep your head and chest upright and keep your abdominal muscles tight. 4. Hold for __________ seconds. 5. To release the stretch, shift  your weight to your back foot. Repeat __________ times. Complete this stretch __________ times a day. Exercise F:Extension, Standing 1. Stand and hold a broomstick, a cane, or a similar object behind your back. ? Your hands should be a little wider than shoulder-width apart. ? Your palms should face away from your back. 2. Keeping your elbows straight and keeping your shoulder muscles relaxed, move the stick away from your body until you feel a stretch in your shoulder. ? Avoid shrugging your shoulders while you move the stick. Keep your shoulder blade tucked down toward the middle of your back. 3. Hold for __________ seconds. 4. Slowly return to the starting position. Repeat __________ times. Complete this exercise __________ times a day. STRENGTHENING EXERCISES These exercises build strength and endurance in your shoulder. Endurance is the ability to use your muscles for a long time, even after they get tired. Exercise G:External Rotation  1. Sit in a stable chair without armrests. 2. Secure an exercise band at elbow height on your left / right side. 3. Place a   soft object, such as a folded towel or a small pillow, between your left / right upper arm and your body to move your elbow a few inches away (about 10 cm) from your side. 4. Hold the end of the band so it is tight and there is no slack. 5. Keeping your elbow pressed against the soft object, move your left / right forearm out, away from your abdomen. Keep your body steady so only your forearm moves. 6. Hold for __________ seconds. 7. Slowly return to the starting position. Repeat __________ times. Complete this exercise __________ times a day. Exercise H:Shoulder Abduction  1. Sit in a stable chair without armrests, or stand. 2. Hold a __________ weight in your left / right hand, or hold an exercise band with both hands. 3. Start with your arms straight down and your left / right palm facing in, toward your body. 4. Slowly  lift your left / right hand out to your side. Do not lift your hand above shoulder height unless your health care provider tells you that this is safe. ? Keep your arms straight. ? Avoid shrugging your shoulder while you do this movement. Keep your shoulder blade tucked down toward the middle of your back. 5. Hold for __________ seconds. 6. Slowly lower your arm, and return to the starting position. Repeat __________ times. Complete this exercise __________ times a day. Exercise I:Shoulder Extension 1. Sit in a stable chair without armrests, or stand. 2. Secure an exercise band to a stable object in front of you where it is at shoulder height. 3. Hold one end of the exercise band in each hand. Your palms should face each other. 4. Straighten your elbows and lift your hands up to shoulder height. 5. Step back, away from the secured end of the exercise band, until the band is tight and there is no slack. 6. Squeeze your shoulder blades together as you pull your hands down to the sides of your thighs. Stop when your hands are straight down by your sides. Do not let your hands go behind your body. 7. Hold for __________ seconds. 8. Slowly return to the starting position. Repeat __________ times. Complete this exercise __________ times a day. Exercise J:Standing Shoulder Row 1. Sit in a stable chair without armrests, or stand. 2. Secure an exercise band to a stable object in front of you so it is at waist height. 3. Hold one end of the exercise band in each hand. Your palms should be in a thumbs-up position. 4. Bend each of your elbows to an "L" shape (about 90 degrees) and keep your upper arms at your sides. 5. Step back until the band is tight and there is no slack. 6. Slowly pull your elbows back behind you. 7. Hold for __________ seconds. 8. Slowly return to the starting position. Repeat __________ times. Complete this exercise __________ times a day. Exercise K:Shoulder  Press-Ups  1. Sit in a stable chair that has armrests. Sit upright, with your feet flat on the floor. 2. Put your hands on the armrests so your elbows are bent and your fingers are pointing forward. Your hands should be about even with the sides of your body. 3. Push down on the armrests and use your arms to lift yourself off of the chair. Straighten your elbows and lift yourself up as much as you comfortably can. ? Move your shoulder blades down, and avoid letting your shoulders move up toward your ears. ? Keep your feet on   the ground. As you get stronger, your feet should support less of your body weight as you lift yourself up. 4. Hold for __________ seconds. 5. Slowly lower yourself back into the chair. Repeat __________ times. Complete this exercise __________ times a day. Exercise L: Wall Push-Ups  1. Stand so you are facing a stable wall. Your feet should be about one arm-length away from the wall. 2. Lean forward and place your palms on the wall at shoulder height. 3. Keep your feet flat on the floor as you bend your elbows and lean forward toward the wall. 4. Hold for __________ seconds. 5. Straighten your elbows to push yourself back to the starting position. Repeat __________ times. Complete this exercise __________ times a day. This information is not intended to replace advice given to you by your health care provider. Make sure you discuss any questions you have with your health care provider. Document Released: 11/08/2004 Document Revised: 09/19/2015 Document Reviewed: 09/05/2014 Elsevier Interactive Patient Education  2018 [employer] Inc[employer]  

## 2017-02-13 NOTE — Progress Notes (Signed)
Subjective:    Patient ID: Kristen Wu, female    DOB: May 19, 1975, 42 y.o.   MRN: [id number]  02/13/2017  ADHD (follow-up) and Anxiety    HPI This 42 y.o. female presents for ten month evaluation of ADHD and anxiety.  Volleyball with middle child and youngest; 10th grade and 5th grade. Travel volleyball. [facility] [employer]; much happier.  Family tolerates pt; less irritable.  Switched to Concerta at last visit because suffered side effects of Adderall.  Had horrible insomnia with Adderall.  Did not want to depend on Ambien to sleep when took Adderall. With Concerta, does not get jittery or anxious.  Able to concentrate.  No insomnia.  No side effects.  Out of medication for a while.  Will get anxious due to lack of attention; has taken Xanax sparingly.  No exercise.  R shoulder pain: must take Ibuprofen and topical cream. Onset several months ago.   Head trauma: hit in head by volleyball during serve.  Lateral head injury.  Occurred 01/29/16. Then two days later, letting dog inside and hit head on corner on doorway going into kitchen.  Left a big dent in head.  Since then, will have headache at sight of trauma.  Poor memory and word finding difficulties.  Anxiety has worsened.  Has noticed difference in mood and reactions.    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 S/p surgical correction in 2015; no pain.     BP Readings from Last 3 Encounters:  02/13/17 108/68  04/14/16 122/73  07/02/14 116/71   Wt Readings from Last 3 Encounters:  02/13/17 140 lb (63.5 kg)  04/14/16 144 lb 12.8 oz (65.7 kg)  07/02/14 144 lb (65.3 kg)   Immunization History  Administered Date(s) Administered  . Tdap 07/28/2009    Review of Systems  Constitutional: Negative for chills, diaphoresis, fatigue and fever.  Eyes: Negative for visual disturbance.  Respiratory: Negative for cough and shortness of breath.   Cardiovascular: Negative for chest pain, palpitations and leg swelling.    Gastrointestinal: Negative for abdominal pain, constipation, diarrhea, nausea and vomiting.  Endocrine: Negative for cold intolerance, heat intolerance, polydipsia, polyphagia and polyuria.  Musculoskeletal: Positive for arthralgias, neck pain and neck stiffness.  Neurological: Positive for numbness and headaches. Negative for dizziness, tremors, seizures, syncope, facial asymmetry, speech difficulty, weakness and light-headedness.  Psychiatric/Behavioral: Positive for decreased concentration and sleep disturbance. Negative for self-injury and suicidal ideas. The patient is nervous/anxious.     Past Medical History:  Diagnosis Date  . Abnormal Pap smear   . ADHD   . Allergy   . Anxiety   . Asthma   . Mitral valve prolapse   . Nephrolithiasis    Past Surgical History:  Procedure Laterality Date  . COLPOSCOPY    . KNEE SURGERY     right  . LAPAROSCOPY     QUESTIONABLE ECTOPIC    Allergies  Allergen Reactions  . Sulfa Drugs Cross Reactors Rash   Current Outpatient Medications on File Prior to Visit  Medication Sig Dispense Refill  . ALPRAZolam (XANAX) 0.5 MG tablet TAKE 1 TABLET BY MOUTH TWICE A DAY AS NEEDED FOR ANXIETY 30 tablet 0  . fluticasone (FLONASE) 50 MCG/ACT nasal spray Place 2 sprays into both nostrils daily. 48 g 3  . levocetirizine (XYZAL) 5 MG tablet Take 1 tablet (5 mg total) by mouth every evening. 90 tablet 3  . montelukast (SINGULAIR) 10 MG tablet TAKE 1 TABLET EVERY DAY IN THE EVENING 90 tablet 3  .  zolpidem (AMBIEN) 10 MG tablet Take 1 tablet (10 mg total) by mouth at bedtime. 30 tablet 1   No current facility-administered medications on file prior to visit.    Social History   Socioeconomic History  . Marital status: Married    Spouse name: Not on file  . Number of children: Not on file  . Years of education: Not on file  . Highest education level: Not on file  Social Needs  . Financial resource strain: Not on file  . Food insecurity - worry: Not  on file  . Food insecurity - inability: Not on file  . Transportation needs - medical: Not on file  . Transportation needs - non-medical: Not on file  Occupational History  . Not on file  Tobacco Use  . Smoking status: Never Smoker  . Smokeless tobacco: Never Used  Substance and Sexual Activity  . Alcohol use: Yes    Alcohol/week: 0.0 oz    Types: 1 - 2 Standard drinks or equivalent per week    Comment: occassionally  . Drug use: No  . Sexual activity: Yes    Partners: Male    Birth control/protection: IUD  Other Topics Concern  . Not on file  Social History Narrative  . Not on file   Family History  Problem Relation Age of Onset  . Hypertension Father   . Arthritis Father   . Heart disease Father   . Hyperlipidemia Father   . Cancer Maternal Grandmother        BREAST   . Hypertension Maternal Grandmother   . Depression Mother   . Asthma Brother   . Asthma Daughter   . Cancer Maternal Uncle        stomach  . Heart disease Paternal Uncle        heart attack  . Hypertension Paternal Uncle   . Hyperlipidemia Paternal Uncle   . Cancer Paternal Grandfather        prostate  . Heart disease Paternal Grandfather        heart attack  . Hypertension Maternal Grandfather        Objective:    BP 108/68   Pulse 70   Temp 98 F (36.7 C) (Oral)   Resp 16   Ht 5' 5.95" (1.675 m)   Wt 140 lb (63.5 kg)   SpO2 99%   BMI 22.63 kg/m  Physical Exam  Constitutional: She is oriented to person, place, and time. She appears well-developed and well-nourished. No distress.  HENT:  Head: Normocephalic and atraumatic.  Right Ear: External ear normal.  Left Ear: External ear normal.  Nose: Nose normal.  Mouth/Throat: Oropharynx is clear and moist.  Eyes: Conjunctivae and EOM are normal. Pupils are equal, round, and reactive to light.  Neck: Normal range of motion. Neck supple. Carotid bruit is not present. No thyromegaly present.  Cardiovascular: Normal rate, regular rhythm,  normal heart sounds and intact distal pulses. Exam reveals no gallop and no friction rub.  No murmur heard. Pulmonary/Chest: Effort normal and breath sounds normal. She has no wheezes. She has no rales.  Abdominal: Soft. Bowel sounds are normal. She exhibits no distension and no mass. There is no tenderness. There is no rebound and no guarding.  Musculoskeletal:       Right shoulder: She exhibits pain. She exhibits normal range of motion, no tenderness, no bony tenderness, no spasm, normal pulse and normal strength.       Cervical back: She exhibits tenderness, pain  and spasm. She exhibits normal range of motion, no bony tenderness and normal pulse.  Lymphadenopathy:    She has no cervical adenopathy.  Neurological: She is alert and oriented to person, place, and time. No cranial nerve deficit. She exhibits normal muscle tone. Coordination normal.  Skin: Skin is warm and dry. No rash noted. She is not diaphoretic. No erythema. No pallor.  Psychiatric: She has a normal mood and affect. Her behavior is normal. Judgment and thought content normal.   No results found. Depression screen [facility] 2/9 02/13/2017 04/14/2016 07/02/2014  Decreased Interest 0 0 0  Down, Depressed, Hopeless 0 0 0  PHQ - 2 Score 0 0 0   Fall Risk  02/13/2017 04/14/2016 07/02/2014  Falls in the past year? No No Yes  Number falls in past yr: - - 1  Injury with Fall? - - Yes  Comment - - FRACTURE LEFT FOOT        Assessment & Plan:   1. Attention deficit hyperactivity disorder (ADHD), combined type   2. Anxiety   3. Seasonal allergic rhinitis due to pollen   4. Mild intermittent asthma without complication   5. Tension headache   6. Postconcussive syndrome   7. Ulnar neuropathy of right upper extremity   8. Sprain of right rotator cuff capsule, initial encounter    ADHD: Stable on Concerta 27 mg daily.  Refills for 3 months provided call in 3 months for additional 54-month refill follow-up in 6 months recommended.  Tolerating  Concerta much better than Adderall therapy.  Sleeping much better.  No longer warranting Ambien therapy. Anxiety intermittent: Stable at this time.  Sparing to no use of  Xanax.  Recommend avoidance of multiple controlled substances. Allergic rhinitis: Well controlled on current regimen. Mild intermittent asthma: Stable at this time.  Use albuterol as needed. Tension headaches: Persistent.  Refill of Zanaflex provided.  Recommend heat and daily stretches.  Next postconcussive syndrome: Patient suffered with head trauma 1 year ago.  Did not undergo evaluation 3.  Continues to have labile mood and one finding difficulties.  Discussed benefit of neurology consultation and imaging studies at this time.  Patient desires to continue to monitor.  Normal neurological exam in office today. Right ulnar neuropathy: Chronic issue for patient.  Suffers with persistent intermittent numbness and tingling.  Supportive care.  Recommend referral back to hand surgery if worsens. Right shoulder sprain: New onset.  Recommend home exercise program.  Good range of motion in office.  If persist, recommend sports medicine consultation. prolonged face-to-face for 40 minutes with greater than 50% of time dedicated to counseling and coordination of care.   No orders of the defined types were placed in this encounter.  Meds ordered this encounter  Medications  . DISCONTD: methylphenidate 27 MG PO TB24    Sig: Take 1 tablet (27 mg total) by mouth daily.    Dispense:  30 tablet    Refill:  0  . DISCONTD: methylphenidate 27 MG PO TB24    Sig: Take 1 tablet (27 mg total) by mouth daily.    Dispense:  30 tablet    Refill:  0    Do not fill for 30 days after prescribed.  . methylphenidate 27 MG PO TB24    Sig: Take 1 tablet (27 mg total) by mouth daily.    Dispense:  30 tablet    Refill:  0    Do not fill for 60 days after prescribed.  Marland Kitchen tiZANidine (ZANAFLEX) 4  MG tablet    Sig: Take 1 tablet (4 mg total) by mouth every 8  (eight) hours as needed for muscle spasms.    Dispense:  90 tablet    Refill:  0    Return in about 6 months (around 08/13/2017) for follow-up chronic medical conditions.   Dylin Breeden Paulita Fujita, M.D. Primary Care at [facility] [address] [place], [place]  [phone] phone [phone] fax

## 2017-02-18 ENCOUNTER — Other Ambulatory Visit: Payer: Self-pay | Admitting: Family Medicine

## 2017-02-19 NOTE — Telephone Encounter (Signed)
Ventolin HFA 90 mcg Last OV 04/14/16 with Dr. Katrinka BlazingSmith where this was addressed. [facility] [place] - [address]

## 2017-03-22 ENCOUNTER — Other Ambulatory Visit: Payer: Self-pay | Admitting: Family Medicine

## 2017-03-25 ENCOUNTER — Encounter: Payer: Self-pay | Admitting: Family Medicine

## 2017-04-24 ENCOUNTER — Other Ambulatory Visit: Payer: Self-pay | Admitting: Family Medicine

## 2017-05-28 ENCOUNTER — Other Ambulatory Visit: Payer: Self-pay | Admitting: Family Medicine

## 2017-05-29 NOTE — Telephone Encounter (Signed)
LOV 02/13/17 Methylphenidate addressed. Last refilled 02/13/17 Zanaflex-last refilled 02/13/17 PCP Dr. Katrinka Blazing  See attached. Routing to office for refills.

## 2017-05-30 ENCOUNTER — Encounter: Payer: Self-pay | Admitting: Family Medicine

## 2017-05-30 MED ORDER — METHYLPHENIDATE HCL ER 27 MG PO TB24: ORAL_TABLET | ORAL | 0 refills | Status: DC

## 2017-05-30 MED ORDER — METHYLPHENIDATE HCL ER 27 MG PO TB24: ORAL_TABLET | ORAL | 0 refills | Status: AC

## 2017-06-04 ENCOUNTER — Telehealth: Payer: Self-pay | Admitting: Family Medicine

## 2017-06-04 NOTE — Telephone Encounter (Signed)
I left voicemail in regards to cancelling/rescheduling her appt with Dr. Katrinka Blazing on 08/19/2017.

## 2017-06-11 ENCOUNTER — Encounter: Payer: Self-pay | Admitting: Family Medicine

## 2017-06-13 ENCOUNTER — Other Ambulatory Visit: Payer: Self-pay | Admitting: Family Medicine

## 2017-06-27 ENCOUNTER — Encounter: Payer: Self-pay | Admitting: Family Medicine

## 2017-07-01 MED ORDER — METHYLPHENIDATE HCL 5 MG PO TABS: 5.0000 mg | ORAL_TABLET | Freq: Every day | ORAL | 0 refills | Status: AC

## 2017-07-01 NOTE — Addendum Note (Signed)
Addended by: Ethelda ChickSMITH, Lorrayne Ismael M on: 07/01/2017 01:32 PM   Modules accepted: Orders

## 2017-08-19 ENCOUNTER — Ambulatory Visit: Payer: BLUE CROSS/BLUE SHIELD | Admitting: Family Medicine

## 2019-04-24 DIAGNOSIS — L237 Allergic contact dermatitis due to plants, except food: Secondary | ICD-10-CM | POA: Diagnosis not present

## 2019-07-13 DIAGNOSIS — Z6823 Body mass index (BMI) 23.0-23.9, adult: Secondary | ICD-10-CM | POA: Diagnosis not present

## 2019-07-13 DIAGNOSIS — M545 Low back pain: Secondary | ICD-10-CM | POA: Diagnosis not present

## 2019-07-13 DIAGNOSIS — R39198 Other difficulties with micturition: Secondary | ICD-10-CM | POA: Diagnosis not present

## 2019-08-17 DIAGNOSIS — F429 Obsessive-compulsive disorder, unspecified: Secondary | ICD-10-CM | POA: Diagnosis not present

## 2019-08-17 DIAGNOSIS — F902 Attention-deficit hyperactivity disorder, combined type: Secondary | ICD-10-CM | POA: Diagnosis not present

## 2019-08-17 DIAGNOSIS — J301 Allergic rhinitis due to pollen: Secondary | ICD-10-CM | POA: Diagnosis not present

## 2019-08-17 DIAGNOSIS — F419 Anxiety disorder, unspecified: Secondary | ICD-10-CM | POA: Diagnosis not present

## 2019-08-25 DIAGNOSIS — Z0000 Encounter for general adult medical examination without abnormal findings: Secondary | ICD-10-CM | POA: Diagnosis not present

## 2019-08-25 DIAGNOSIS — G500 Trigeminal neuralgia: Secondary | ICD-10-CM | POA: Diagnosis not present

## 2019-08-25 DIAGNOSIS — R5383 Other fatigue: Secondary | ICD-10-CM | POA: Diagnosis not present

## 2019-08-25 DIAGNOSIS — Z1159 Encounter for screening for other viral diseases: Secondary | ICD-10-CM | POA: Diagnosis not present

## 2019-09-03 DIAGNOSIS — J029 Acute pharyngitis, unspecified: Secondary | ICD-10-CM | POA: Diagnosis not present

## 2019-09-24 DIAGNOSIS — K120 Recurrent oral aphthae: Secondary | ICD-10-CM | POA: Diagnosis not present

## 2019-09-24 DIAGNOSIS — J302 Other seasonal allergic rhinitis: Secondary | ICD-10-CM | POA: Diagnosis not present

## 2019-09-24 DIAGNOSIS — R61 Generalized hyperhidrosis: Secondary | ICD-10-CM | POA: Diagnosis not present

## 2019-09-24 DIAGNOSIS — Z0000 Encounter for general adult medical examination without abnormal findings: Secondary | ICD-10-CM | POA: Diagnosis not present

## 2019-09-24 DIAGNOSIS — M545 Low back pain: Secondary | ICD-10-CM | POA: Diagnosis not present

## 2019-09-24 DIAGNOSIS — F429 Obsessive-compulsive disorder, unspecified: Secondary | ICD-10-CM | POA: Diagnosis not present

## 2019-09-24 DIAGNOSIS — F419 Anxiety disorder, unspecified: Secondary | ICD-10-CM | POA: Diagnosis not present

## 2019-09-24 DIAGNOSIS — F902 Attention-deficit hyperactivity disorder, combined type: Secondary | ICD-10-CM | POA: Diagnosis not present

## 2019-09-28 DIAGNOSIS — M47816 Spondylosis without myelopathy or radiculopathy, lumbar region: Secondary | ICD-10-CM | POA: Diagnosis not present

## 2019-09-28 DIAGNOSIS — M545 Low back pain: Secondary | ICD-10-CM | POA: Diagnosis not present

## 2019-09-28 DIAGNOSIS — M549 Dorsalgia, unspecified: Secondary | ICD-10-CM | POA: Diagnosis not present

## 2019-09-30 DIAGNOSIS — Z1231 Encounter for screening mammogram for malignant neoplasm of breast: Secondary | ICD-10-CM | POA: Diagnosis not present

## 2019-10-14 DIAGNOSIS — M545 Low back pain, unspecified: Secondary | ICD-10-CM | POA: Diagnosis not present

## 2019-10-14 DIAGNOSIS — S336XXD Sprain of sacroiliac joint, subsequent encounter: Secondary | ICD-10-CM | POA: Diagnosis not present

## 2019-10-15 DIAGNOSIS — F419 Anxiety disorder, unspecified: Secondary | ICD-10-CM | POA: Diagnosis not present

## 2019-10-15 DIAGNOSIS — Z6822 Body mass index (BMI) 22.0-22.9, adult: Secondary | ICD-10-CM | POA: Diagnosis not present

## 2019-10-15 DIAGNOSIS — F429 Obsessive-compulsive disorder, unspecified: Secondary | ICD-10-CM | POA: Diagnosis not present

## 2019-10-15 DIAGNOSIS — F902 Attention-deficit hyperactivity disorder, combined type: Secondary | ICD-10-CM | POA: Diagnosis not present

## 2019-10-16 DIAGNOSIS — M545 Low back pain, unspecified: Secondary | ICD-10-CM | POA: Diagnosis not present

## 2019-10-16 DIAGNOSIS — S336XXD Sprain of sacroiliac joint, subsequent encounter: Secondary | ICD-10-CM | POA: Diagnosis not present

## 2019-11-11 DIAGNOSIS — R059 Cough, unspecified: Secondary | ICD-10-CM | POA: Diagnosis not present

## 2019-11-11 DIAGNOSIS — Z01818 Encounter for other preprocedural examination: Secondary | ICD-10-CM | POA: Diagnosis not present

## 2019-11-11 DIAGNOSIS — Z6822 Body mass index (BMI) 22.0-22.9, adult: Secondary | ICD-10-CM | POA: Diagnosis not present

## 2019-11-11 DIAGNOSIS — R0602 Shortness of breath: Secondary | ICD-10-CM | POA: Diagnosis not present

## 2019-11-11 DIAGNOSIS — Z20822 Contact with and (suspected) exposure to covid-19: Secondary | ICD-10-CM | POA: Diagnosis not present

## 2019-11-17 DIAGNOSIS — U071 COVID-19: Secondary | ICD-10-CM | POA: Diagnosis not present

## 2019-11-17 DIAGNOSIS — B20 Human immunodeficiency virus [HIV] disease: Secondary | ICD-10-CM | POA: Diagnosis not present

## 2019-11-17 DIAGNOSIS — J4521 Mild intermittent asthma with (acute) exacerbation: Secondary | ICD-10-CM | POA: Diagnosis not present

## 2019-11-19 DIAGNOSIS — U071 COVID-19: Secondary | ICD-10-CM | POA: Diagnosis not present

## 2019-11-26 DIAGNOSIS — J4521 Mild intermittent asthma with (acute) exacerbation: Secondary | ICD-10-CM | POA: Diagnosis not present

## 2019-11-26 DIAGNOSIS — R001 Bradycardia, unspecified: Secondary | ICD-10-CM | POA: Diagnosis not present

## 2019-11-26 DIAGNOSIS — J0100 Acute maxillary sinusitis, unspecified: Secondary | ICD-10-CM | POA: Diagnosis not present

## 2019-11-26 DIAGNOSIS — U071 COVID-19: Secondary | ICD-10-CM | POA: Diagnosis not present
